# Patient Record
Sex: Female | Born: 1960 | Race: White | Hispanic: No | Marital: Single | Smoking: Never smoker
Health system: Southern US, Community
[De-identification: ages and names within clinical notes are randomized; demographics above are authoritative.]

## PROBLEM LIST (undated history)

## (undated) DIAGNOSIS — F32A Depression, unspecified: Secondary | ICD-10-CM

## (undated) DIAGNOSIS — E7800 Pure hypercholesterolemia, unspecified: Secondary | ICD-10-CM

## (undated) DIAGNOSIS — H9190 Unspecified hearing loss, unspecified ear: Secondary | ICD-10-CM

## (undated) DIAGNOSIS — E785 Hyperlipidemia, unspecified: Secondary | ICD-10-CM

## (undated) DIAGNOSIS — I10 Essential (primary) hypertension: Secondary | ICD-10-CM

## (undated) DIAGNOSIS — F840 Autistic disorder: Secondary | ICD-10-CM

## (undated) DIAGNOSIS — F73 Profound intellectual disabilities: Secondary | ICD-10-CM

## (undated) HISTORY — DX: Hyperlipidemia, unspecified: E78.5

## (undated) HISTORY — PX: KNEE SURGERY: SHX244

## (undated) HISTORY — DX: Unspecified hearing loss, unspecified ear: H91.90

## (undated) HISTORY — DX: Essential (primary) hypertension: I10

## (undated) HISTORY — DX: Depression, unspecified: F32.A

## (undated) HISTORY — DX: Pure hypercholesterolemia, unspecified: E78.00

## (undated) HISTORY — PX: BREAST REDUCTION SURGERY: SHX8

## (undated) HISTORY — DX: Profound intellectual disabilities: F73

## (undated) HISTORY — DX: Autistic disorder: F84.0

---

## 1999-03-29 ENCOUNTER — Ambulatory Visit (HOSPITAL_COMMUNITY): Admission: RE | Admit: 1999-03-29 | Discharge: 1999-03-29 | Payer: Self-pay | Admitting: Otolaryngology

## 2001-03-01 ENCOUNTER — Other Ambulatory Visit: Admission: RE | Admit: 2001-03-01 | Discharge: 2001-03-01 | Payer: Self-pay | Admitting: Obstetrics and Gynecology

## 2001-05-13 ENCOUNTER — Ambulatory Visit (HOSPITAL_COMMUNITY): Admission: RE | Admit: 2001-05-13 | Discharge: 2001-05-13 | Payer: Self-pay | Admitting: Neurology

## 2001-05-13 ENCOUNTER — Encounter: Payer: Self-pay | Admitting: Neurology

## 2004-02-24 ENCOUNTER — Encounter: Admission: RE | Admit: 2004-02-24 | Discharge: 2004-02-24 | Payer: Self-pay

## 2006-08-09 ENCOUNTER — Ambulatory Visit (HOSPITAL_BASED_OUTPATIENT_CLINIC_OR_DEPARTMENT_OTHER): Admission: RE | Admit: 2006-08-09 | Discharge: 2006-08-09 | Payer: Self-pay | Admitting: Orthopedic Surgery

## 2006-09-13 ENCOUNTER — Encounter: Admission: RE | Admit: 2006-09-13 | Discharge: 2006-09-13 | Payer: Self-pay | Admitting: Orthopedic Surgery

## 2007-03-19 ENCOUNTER — Other Ambulatory Visit: Admission: RE | Admit: 2007-03-19 | Discharge: 2007-03-19 | Payer: Self-pay | Admitting: Obstetrics and Gynecology

## 2007-11-01 IMAGING — US US EXTREM LOW VENOUS*L*
1 series · 14 of 24 positions shown · non-contrast
Comparison: none

CLINICAL DATA: Left lower leg pain and swelling.

LEFT LOWER EXTREMITY VENOUS DOPPLER ULTRASOUND:

[Series 1: unknown · 14 of 35 slices shown]
[im 1/35]
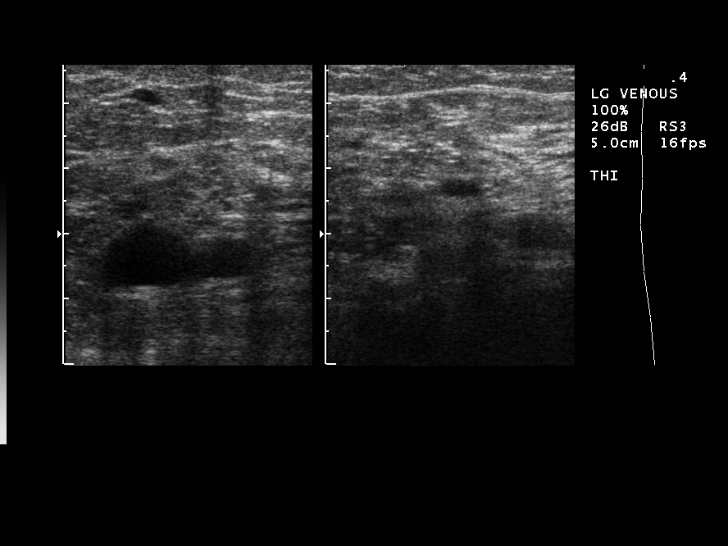
[im 3/35]
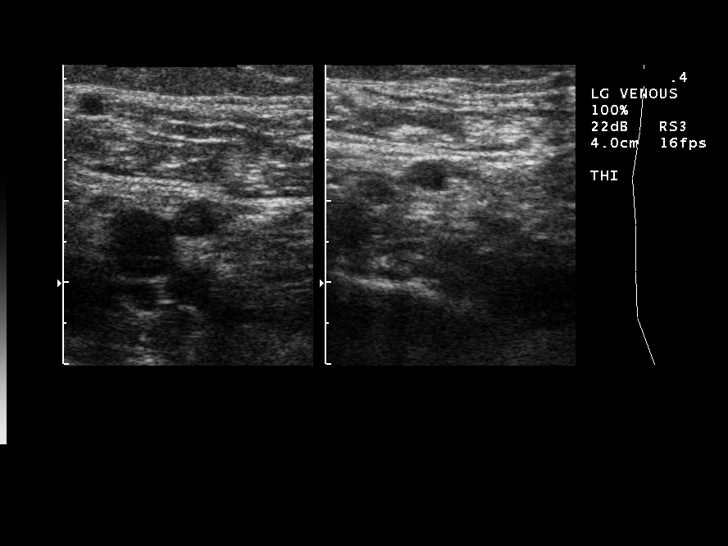
[im 6/35]
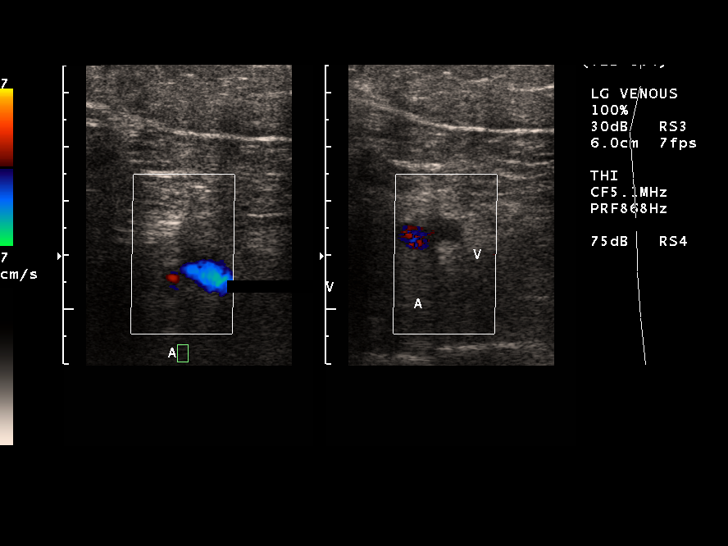
[im 9/35]
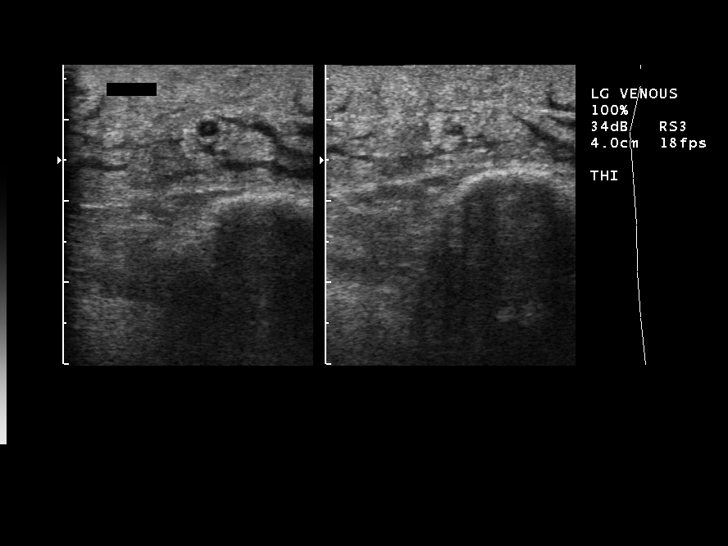
[im 11/35]
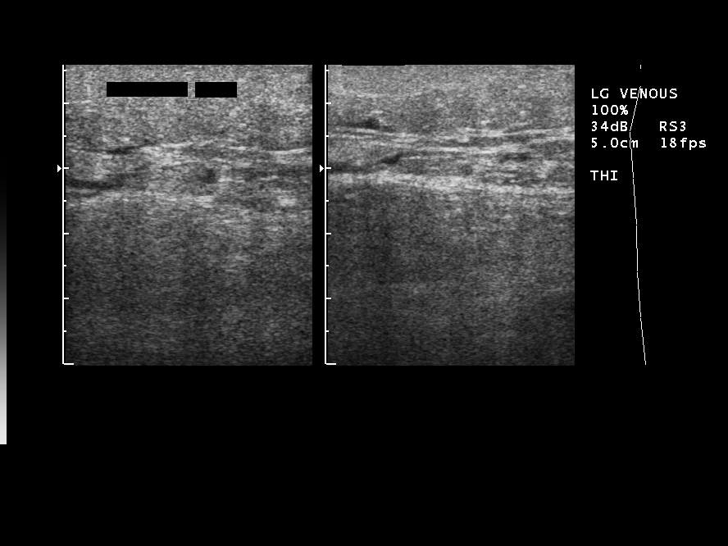
[im 14/35]
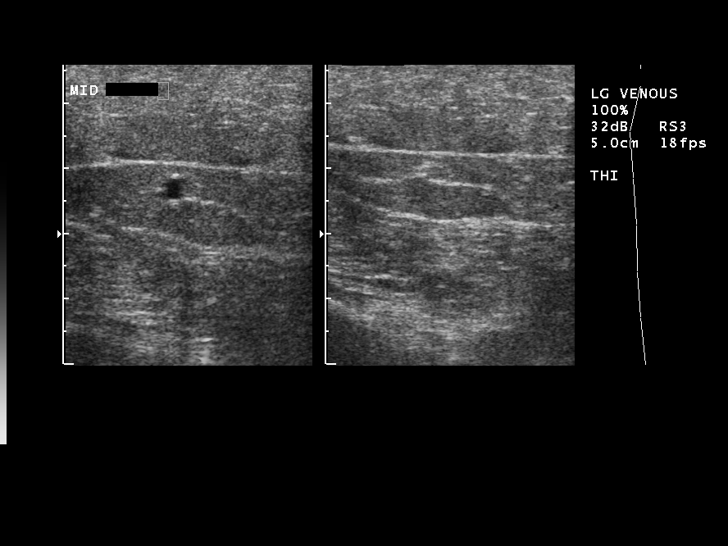
[im 17/35]
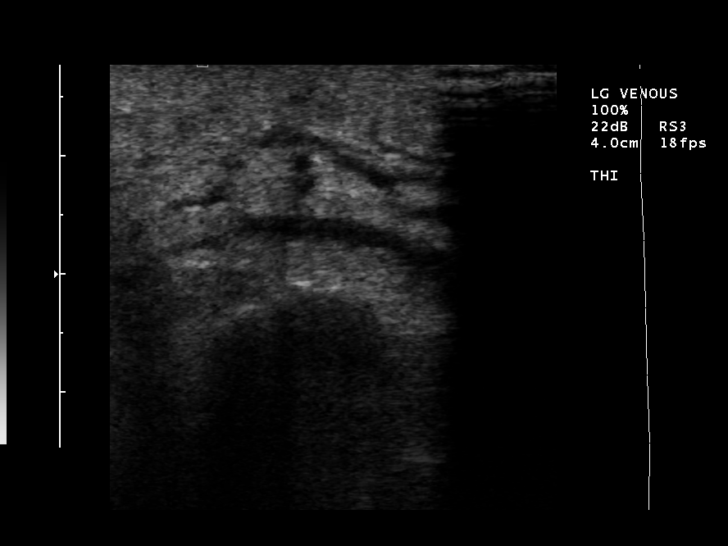
[im 18/35]
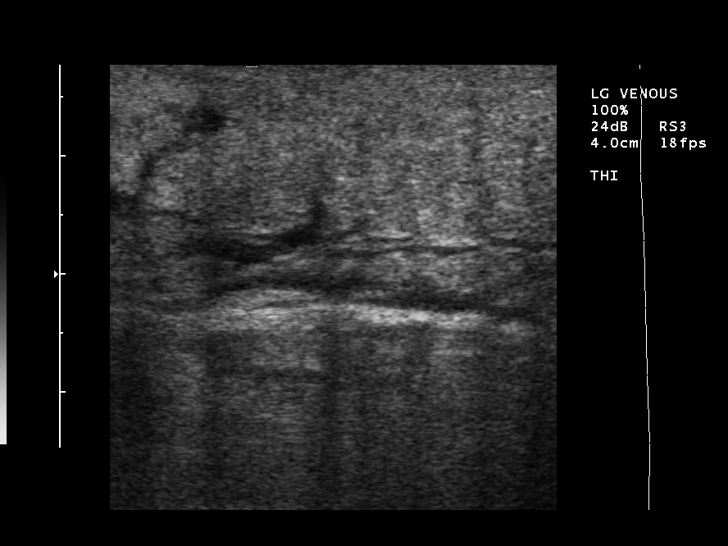
[im 21/35]
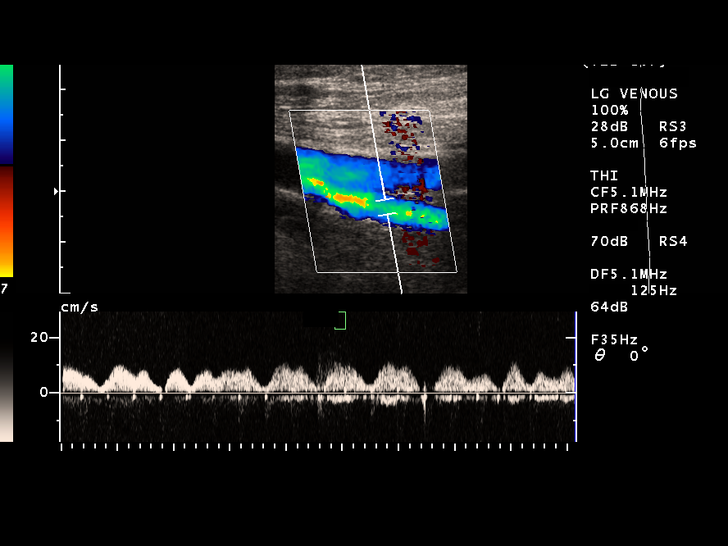
[im 24/35]
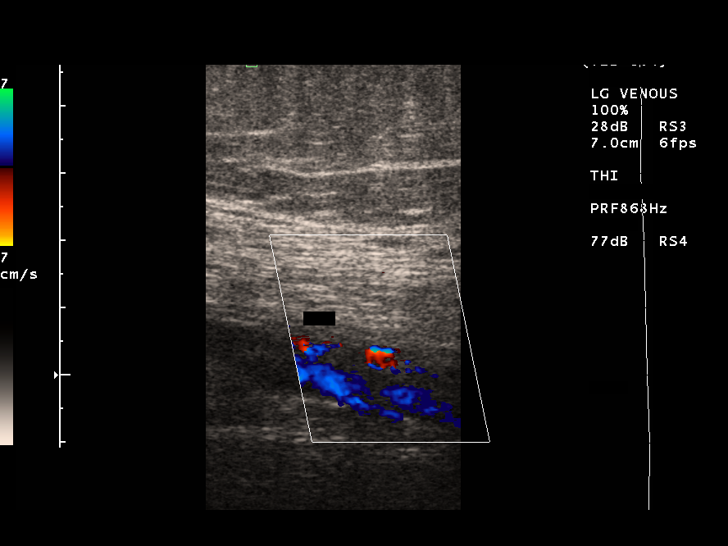
[im 27/35]
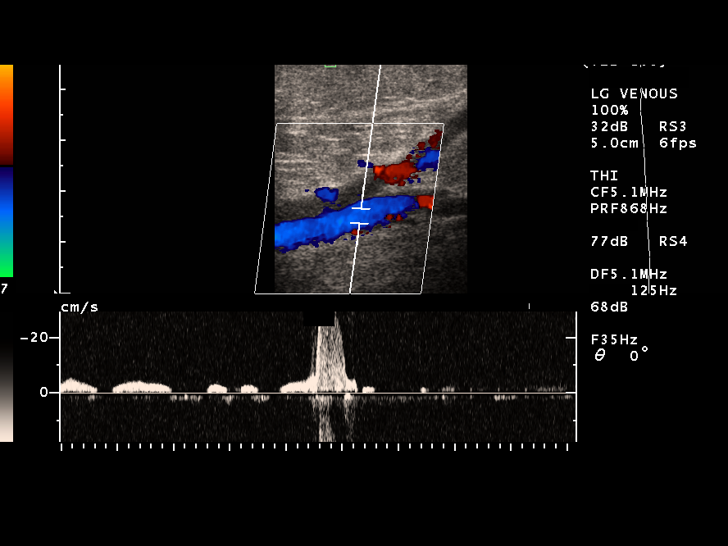
[im 29/35]
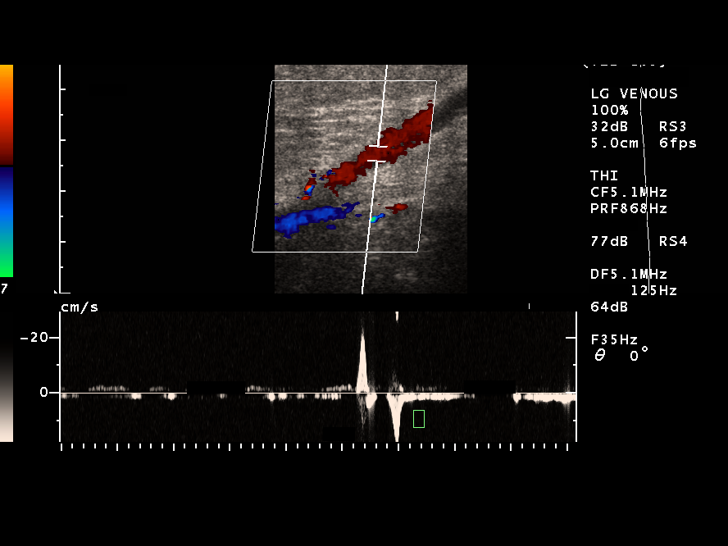
[im 32/35]
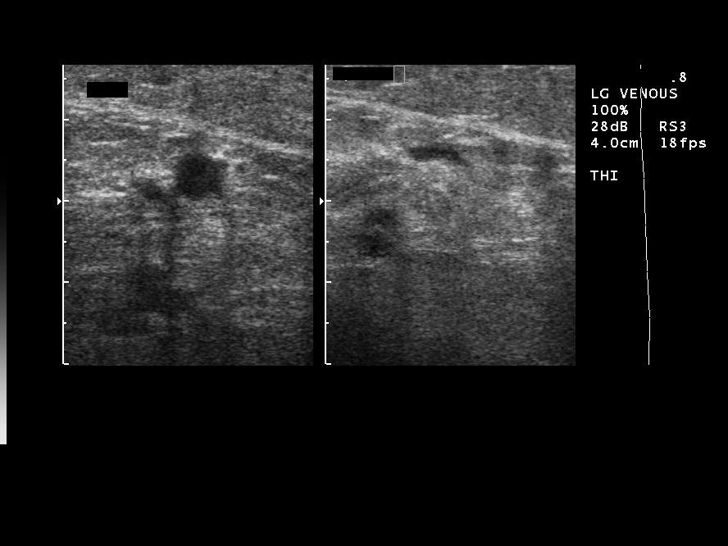
[im 35/35]
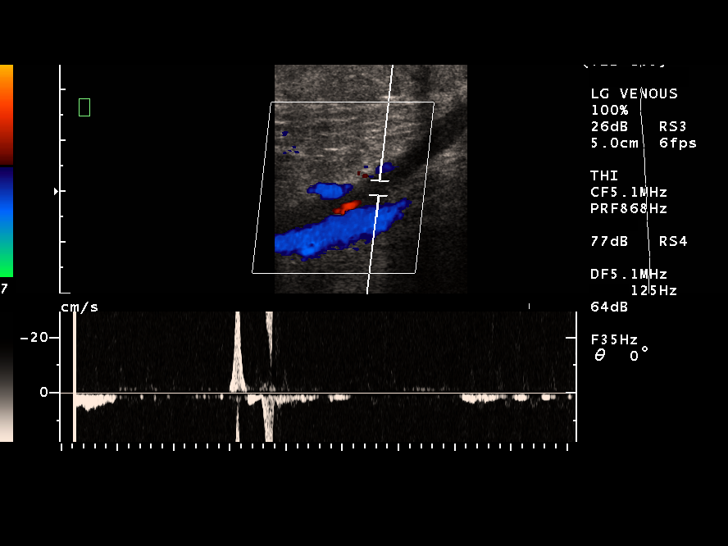

[14 of 24 positions shown; findings below may reference images not displayed]

FINDINGS: Real time, color-flow Doppler and duplex Doppler evaluation of the
left common femoral, superficial femoral, popliteal, deep femoral and greater
saphenous veins was performed. These have normal appearances and demonstrate
normal compressibility and augmentation. No intraluminal thrombi seen. Disuse
soft tissue swelling is noted in the lower leg.

IMPRESSION

Diffuse lower leg soft tissue swelling. Otherwise, normal examination. No deep
venous thrombi seen.

## 2008-04-06 ENCOUNTER — Other Ambulatory Visit: Admission: RE | Admit: 2008-04-06 | Discharge: 2008-04-06 | Payer: Self-pay | Admitting: Obstetrics and Gynecology

## 2010-04-08 ENCOUNTER — Other Ambulatory Visit: Admission: RE | Admit: 2010-04-08 | Discharge: 2010-04-08 | Payer: Self-pay | Admitting: Obstetrics and Gynecology

## 2010-11-04 NOTE — Op Note (Signed)
Jennifer Schultz, Jennifer Schultz              ACCOUNT NO.:  [id number]   MEDICAL RECORD NO.:  [id number]          PATIENT TYPE:  AMB   LOCATION:  DSC                          FACILITY:  [facility]   PHYSICIAN:  Rodney A. Mortenson, M.D.DATE OF BIRTH:  [birth year]   DATE OF PROCEDURE:  DATE OF DISCHARGE:                               OPERATIVE REPORT   Audio too short to transcribe (less than 5 seconds)           ______________________________  Lenard Galloway. Chaney Malling, M.D.     RAM/MEDQ  D:  08/09/2006  T:  08/09/2006  Job:  [id number]

## 2010-11-04 NOTE — Op Note (Signed)
Jennifer Schultz, Jennifer Schultz              ACCOUNT NO.:  [id number]   MEDICAL RECORD NO.:  [id number]          PATIENT TYPE:  AMB   LOCATION:  DSC                          FACILITY:  [facility]   PHYSICIAN:  Rodney A. Mortenson, M.D.DATE OF BIRTH:  1960-12-18   DATE OF PROCEDURE:  08/09/2006  DATE OF DISCHARGE:                               OPERATIVE REPORT   JUSTIFICATION:  A 50 year old female severely mentally retarded has not  been able walk with the left knee for the past several weeks.  She  cannot give any kind of history.  She will not weight bear.  She will  not move the knee.  She is an extreme pain.  Unable to get MRI on the  knee and she has to be put to sleep.  A long discussion with the mother  who is legal guardian and arthroscopic evaluation of the knee was  indicated and the family wished to proceed.  Questions answered and  encouraged preoperatively.  Complications discussed.   PREOPERATIVE DIAGNOSIS:  Probable internal derangement, left knee.   POSTOPERATIVE DIAGNOSIS:  Large osteochondral fracture with loose body,  left knee and lateral tracking of the left patella.   OPERATION:  Arthroscopy; removal of large loose body, lateral release.   SURGEON:  Lenard Galloway. Chaney Malling, M.D.   ANESTHESIA:  General.   PATHOLOGY:  With the arthroscope in the knee, very careful examination  was undertaken.  There was a great deal of blood and blood clots.  In  the suprapatellar pouch, there was a very large osteochondral fragment  which removed as a single large entity.  A very careful examination of  the superior pouch was done and no other loose bodies were seen.  The  posterior aspect of the patella appeared fairly normal with the patella  tracking far laterally.  The arthroscope was then brought into the  medial compartment.  There was normal articular cartilage of the medial  femoral condyle and medial tibial plateau and the entire circumference  of the medial meniscus was normal.  We  arthroscoped in the intercondylar  notch.  The ACL was intact, but there was a large defect on the medial  side of the medial femoral condyle and may be the origin of the  osteochondral fracture.  The lateral compartment appeared normal.   PROCEDURE:  The patient was placed on the operating table in the supine  position.  Pneumatic tourniquet applied to the left upper thigh.  Left  leg was placed in the leg holder and the entire left lower extremity  prepped with DuraPrep and draped out in the usual manner.  An infusion  cannula was placed in the superior medial pouch and the knee distended  with saline.  Anteromedial and anterolateral portals were made and the  arthroscope introduced.  Findings are as described above.  Using the  pituitary, the very large osteochondral fragment was removed.  Superior  pouch was then debrided with the intra-articular shaver.  There was  significant lateral tracking of the patella and partial lateral release  was done with the intra-articular shaver.  Once this was  accomplished,  the patella tracked more towards the midline.  The posterior aspect of  the patella did not show any obvious fractures.  Again the rest of the  knee was very carefully exam.  The ACL was intact, but there was a  defect on the medial side of the femoral notch adjacent to the inner  border of the medial femoral condyle and probably the origin of the  osteochondral fracture.  The medial meniscus was normal as noted above.  In the lateral compartment no pathology was seen.  A great deal of time  was spent looking throughout the knee and no other pathology was noted.  The knee was then filled with Marcaine.  A large bulky pressure dressing  applied.  The patient returned to recovery room in nice condition.  Technically this procedure went extremely well.   DISPOSITION:  1. Follow-up care to the office next week on Wednesday.  2. Vicodin for pain.            ______________________________  Lenard Galloway. Chaney Malling, M.D.     RAM/MEDQ  D:  08/09/2006  T:  08/09/2006  Job:  [id number]

## 2012-05-06 ENCOUNTER — Other Ambulatory Visit (HOSPITAL_COMMUNITY)
Admission: RE | Admit: 2012-05-06 | Discharge: 2012-05-06 | Disposition: A | Discharge status: Home / Self Care | Payer: Medicare Other | Source: Ambulatory Visit | Attending: Obstetrics and Gynecology | Admitting: Obstetrics and Gynecology

## 2012-05-06 ENCOUNTER — Other Ambulatory Visit: Payer: Self-pay | Admitting: Adult Health

## 2012-05-06 DIAGNOSIS — Z124 Encounter for screening for malignant neoplasm of cervix: Secondary | ICD-10-CM | POA: Insufficient documentation

## 2012-05-06 DIAGNOSIS — Z1151 Encounter for screening for human papillomavirus (HPV): Secondary | ICD-10-CM | POA: Insufficient documentation

## 2012-12-27 ENCOUNTER — Ambulatory Visit: Payer: Medicare Other | Attending: Internal Medicine | Admitting: Occupational Therapy

## 2012-12-27 DIAGNOSIS — R4189 Other symptoms and signs involving cognitive functions and awareness: Secondary | ICD-10-CM | POA: Insufficient documentation

## 2012-12-27 DIAGNOSIS — IMO0001 Reserved for inherently not codable concepts without codable children: Secondary | ICD-10-CM | POA: Insufficient documentation

## 2016-09-20 ENCOUNTER — Encounter: Payer: Self-pay | Admitting: *Deleted

## 2016-09-20 ENCOUNTER — Other Ambulatory Visit: Payer: Self-pay | Admitting: Adult Health

## 2017-01-31 ENCOUNTER — Other Ambulatory Visit (HOSPITAL_COMMUNITY)
Admission: RE | Admit: 2017-01-31 | Discharge: 2017-01-31 | Disposition: A | Discharge status: Home / Self Care | Payer: Medicare Other | Source: Ambulatory Visit | Attending: Adult Health | Admitting: Adult Health

## 2017-01-31 ENCOUNTER — Encounter: Payer: Self-pay | Admitting: Adult Health

## 2017-01-31 ENCOUNTER — Ambulatory Visit (INDEPENDENT_AMBULATORY_CARE_PROVIDER_SITE_OTHER): Payer: Medicare Other | Admitting: Adult Health

## 2017-01-31 VITALS — BP 142/90 | HR 70 | Ht 60.0 in | Wt 123.0 lb

## 2017-01-31 DIAGNOSIS — Z1211 Encounter for screening for malignant neoplasm of colon: Secondary | ICD-10-CM | POA: Insufficient documentation

## 2017-01-31 DIAGNOSIS — Z124 Encounter for screening for malignant neoplasm of cervix: Secondary | ICD-10-CM | POA: Insufficient documentation

## 2017-01-31 DIAGNOSIS — Z1212 Encounter for screening for malignant neoplasm of rectum: Secondary | ICD-10-CM | POA: Insufficient documentation

## 2017-01-31 DIAGNOSIS — Z0000 Encounter for general adult medical examination without abnormal findings: Secondary | ICD-10-CM

## 2017-01-31 DIAGNOSIS — Z01419 Encounter for gynecological examination (general) (routine) without abnormal findings: Secondary | ICD-10-CM | POA: Diagnosis not present

## 2017-01-31 DIAGNOSIS — N926 Irregular menstruation, unspecified: Secondary | ICD-10-CM | POA: Insufficient documentation

## 2017-01-31 LAB — HEMOCCULT GUIAC POC 1CARD (OFFICE): FECAL OCCULT BLD: NEGATIVE

## 2017-01-31 NOTE — Progress Notes (Signed)
Patient ID: Jennifer Schultz, female   DOB: 12-10-1960, 56 y.o.   MRN: [id number] History of Present Illness: Jennifer Schultz is a 56 year old white female with autism that resides at [facility].Caregiver with her.  PCP is Madelon LipsJoanna Cater, FNP and she sees Dr Clovis Fredricksonhandler,psycharitis, in [place].  Current Medications, Allergies, Past Medical History, Past Surgical History, Family History and Social History were reviewed in [facility] electronic medical record.     Review of Systems: Patient denies any headaches, hearing loss, fatigue, blurred vision, shortness of breath, chest pain, abdominal pain, problems with bowel movements, urination, or intercourse(never had sex). No joint pain or mood swings. She is non verbal but care giver says she has no complaints, is on OCs and periods not regular will miss for 3-4 months.   Physical Exam:BP (!) 142/90 (BP Location: Left Arm, Patient Position: Sitting, Cuff Size: Normal)   Pulse 70   Ht 5' (1.524 m)   Wt 123 lb (55.8 kg)   BMI 24.02 kg/m  General:  Well developed, well nourished, no acute distress Skin:  Warm and dry and clean Neck:  Midline trachea, normal thyroid, good ROM, no lymphadenopathy Lungs; Clear to auscultation bilaterally Breast:  No dominant palpable mass, retraction, or nipple discharge,sp breast reduction,scars well healed Cardiovascular: Regular rate and rhythm Abdomen:  Soft, non tender, no hepatosplenomegaly Pelvic:  External genitalia is normal in appearance, no lesions.  The vagina is normal in appearance. Urethra has no lesions or masses. The cervix is is not visualized and pap with HPV done with blind sweep, used pediatric speculum.  Uterus is felt to be normal size, shape, and contour.  No adnexal masses or tenderness noted.Bladder is non tender, no masses felt. Rectal: Good sphincter tone, no polyps, or hemorrhoids felt.  Hemoccult negative. Extremities/musculoskeletal:  No swelling or varicosities noted, no clubbing  or cyanosis Psych: Non verbal, cooperative PHQ 2 score 0.   Impression: 1. Encounter for routine gynecological examination with Papanicolaou smear of cervix   2. Routine cervical smear   3. Menstrual periods irregular   4. Screening for colorectal cancer       Plan: Stop OCs for 4 weeks and check FSH of not PM resume  Pap and physical in 3 years Mammograms yearly Labs per PCP

## 2017-02-02 LAB — CYTOLOGY - PAP
Diagnosis: NEGATIVE
HPV (WINDOPATH): NOT DETECTED

## 2017-03-07 ENCOUNTER — Encounter: Payer: Self-pay | Admitting: Physician Assistant

## 2017-03-07 ENCOUNTER — Ambulatory Visit: Payer: Medicaid Other | Admitting: Physician Assistant

## 2017-03-07 VITALS — BP 140/100 | HR 83 | Temp 97.9°F | Wt 123.6 lb

## 2017-03-07 DIAGNOSIS — Z1239 Encounter for other screening for malignant neoplasm of breast: Secondary | ICD-10-CM

## 2017-03-07 DIAGNOSIS — I10 Essential (primary) hypertension: Secondary | ICD-10-CM | POA: Insufficient documentation

## 2017-03-07 DIAGNOSIS — E785 Hyperlipidemia, unspecified: Secondary | ICD-10-CM | POA: Insufficient documentation

## 2017-03-07 DIAGNOSIS — F840 Autistic disorder: Secondary | ICD-10-CM | POA: Insufficient documentation

## 2017-03-07 DIAGNOSIS — R4701 Aphasia: Secondary | ICD-10-CM | POA: Insufficient documentation

## 2017-03-07 DIAGNOSIS — F79 Unspecified intellectual disabilities: Secondary | ICD-10-CM | POA: Insufficient documentation

## 2017-03-07 NOTE — Progress Notes (Signed)
BP (!) 140/100 (BP Location: Left Arm, Patient Position: Sitting, Cuff Size: Normal)   Pulse 83   Temp 97.9 F (36.6 C)   Wt 123 lb 9.6 oz (56.1 kg)   SpO2 93%   BMI 24.14 kg/m    Subjective:    Patient ID: Jennifer Schultz, female    DOB: 03-27-1961, 56 y.o.   MRN: [id number]  HPI: Jennifer Schultz is a 56 y.o. female presenting on 03/07/2017 for Follow-up   HPI   I am seeing pt at [facility] clinic as interim provider until new permanent provider takes over mid-October   pt is resident of [facility] and only communicates with very limited sign language.  She has intellectual disability, autism spectrum disorder, expressive aphasia.  Her caregiver is with her today and reports no recent problems.   Pt is unable to provide and information.   Relevant past medical, surgical, family and social history reviewed and updated as indicated. Interim medical history since our last visit reviewed. Allergies and medications reviewed and updated.   Current Outpatient Prescriptions:  .  amLODipine (NORVASC) 2.5 MG tablet, Take 2.5 mg by mouth daily. , Disp: , Rfl:  .  carbamide peroxide (DEBROX) 6.5 % OTIC solution, Place 3-4 drops into both ears once a week., Disp: , Rfl:  .  chlorhexidine (PERIDEX) 0.12 % solution, Use as directed 10 mLs in the mouth or throat 2 times daily. Swish 1 to 2 minutes and expectorate, Disp: , Rfl:  .  docusate sodium (COLACE) 100 MG capsule, Take 100 mg by mouth 2 (two) times daily. , Disp: , Rfl:  .  FLUoxetine (PROZAC) 20 MG capsule, Take 20 mg by mouth daily. , Disp: , Rfl:  .  ketoconazole (NIZORAL) 2 % cream, Apply 1 application topically 2 (two) times daily., Disp: , Rfl:  .  levonorgestrel-ethinyl estradiol (NORDETTE) 0.15-30 MG-MCG tablet, Take 1 tablet by mouth daily. , Disp: , Rfl:  .  pravastatin (PRAVACHOL) 80 MG tablet, Take 80 mg by mouth daily., Disp: , Rfl:  .  selenium sulfide (SELSUN) 2.5 % shampoo, Apply 1 application topically once a  week., Disp: , Rfl:  .  alum & mag hydroxide-simeth (ANTACID LIQUID) 200-200-20 MG/5ML suspension, Take 10 mLs by mouth as needed for indigestion or heartburn., Disp: , Rfl:  .  diphenhydrAMINE (BENADRYL) 25 mg capsule, Take 25 mg by mouth as needed for allergies., Disp: , Rfl:  .  hydrocortisone cream 1 %, Apply 1 application topically 3 (three) times daily., Disp: , Rfl:  .  loperamide (IMODIUM) 2 MG capsule, Take 4 mg by mouth as needed for diarrhea or loose stools., Disp: , Rfl:  .  magnesium hydroxide (MILK OF MAGNESIA) 400 MG/5ML suspension, Take 30 mLs by mouth as needed for mild constipation., Disp: , Rfl:  .  Vitamin D, Ergocalciferol, (DRISDOL) 50000 units CAPS capsule, Take 50,000 Units by mouth every 7 (seven) days. ON SUNDAYS, Disp: , Rfl:    Review of Systems  Constitutional: Negative for appetite change, chills, diaphoresis, fatigue, fever and unexpected weight change.  HENT: Negative for congestion, drooling, ear pain, facial swelling, hearing loss, mouth sores, sneezing, sore throat, trouble swallowing and voice change.   Eyes: Negative for pain, discharge, redness, itching and visual disturbance.  Respiratory: Negative for cough, choking, shortness of breath and wheezing.   Cardiovascular: Negative for chest pain, palpitations and leg swelling.  Gastrointestinal: Negative for abdominal pain, blood in stool, constipation, diarrhea and vomiting.  Endocrine:  Negative for cold intolerance, heat intolerance and polydipsia.  Genitourinary: Negative for decreased urine volume, dysuria and hematuria.  Musculoskeletal: Negative for arthralgias, back pain and gait problem.  Skin: Negative for rash.  Allergic/Immunologic: Negative for environmental allergies.  Neurological: Negative for seizures, syncope, light-headedness and headaches.  Hematological: Negative for adenopathy.  Psychiatric/Behavioral: Negative for agitation, dysphoric mood and suicidal ideas. The patient is  nervous/anxious.     Per HPI unless specifically indicated above     Objective:    BP (!) 140/100 (BP Location: Left Arm, Patient Position: Sitting, Cuff Size: Normal)   Pulse 83   Temp 97.9 F (36.6 C)   Wt 123 lb 9.6 oz (56.1 kg)   SpO2 93%   BMI 24.14 kg/m   Wt Readings from Last 3 Encounters:  03/07/17 123 lb 9.6 oz (56.1 kg)  01/31/17 123 lb (55.8 kg)    Physical Exam  Constitutional: She appears well-developed and well-nourished.  Pt is flailing out with her arms and legs today, spontaneously, without warning. This somewhat limits today's exam  HENT:  Head: Normocephalic and atraumatic.  Neck: Neck supple.  Cardiovascular: Normal rate and regular rhythm.   Pulmonary/Chest: Effort normal and breath sounds normal.  Abdominal: Soft. Bowel sounds are normal. She exhibits no mass. There is no hepatosplenomegaly. There is no tenderness.  Musculoskeletal: She exhibits no edema.  Lymphadenopathy:    She has no cervical adenopathy.  Neurological: She is alert.  Skin: Skin is warm and dry.  Psychiatric: Her mood appears anxious. She is noncommunicative.  Vitals reviewed.       Assessment & Plan:    Encounter Diagnoses  Name Primary?  . Essential hypertension Yes  . Hyperlipidemia, unspecified hyperlipidemia type   . Intellectual disability   . Autism spectrum disorder   . Expressive aphasia   . Screening for breast cancer     -no changes to medications today -will monitor blood pressure -will Check lipids, cmp -will Order screening mammogram -pt to follow up in 3 months. RTO sooner prn

## 2019-01-23 ENCOUNTER — Other Ambulatory Visit: Payer: Self-pay | Admitting: Family

## 2019-01-23 ENCOUNTER — Other Ambulatory Visit (HOSPITAL_COMMUNITY): Payer: Self-pay | Admitting: Family

## 2019-01-23 DIAGNOSIS — M79662 Pain in left lower leg: Secondary | ICD-10-CM

## 2019-01-28 ENCOUNTER — Ambulatory Visit (HOSPITAL_COMMUNITY)
Admission: RE | Admit: 2019-01-28 | Discharge: 2019-01-28 | Disposition: A | Discharge status: Home / Self Care | Payer: Medicare Other | Source: Ambulatory Visit | Attending: Family | Admitting: Family

## 2019-01-28 ENCOUNTER — Other Ambulatory Visit: Payer: Self-pay

## 2019-01-28 DIAGNOSIS — M79662 Pain in left lower leg: Secondary | ICD-10-CM

## 2019-07-29 ENCOUNTER — Ambulatory Visit: Payer: Medicare Other | Admitting: Urology

## 2020-09-01 ENCOUNTER — Encounter: Payer: Self-pay | Admitting: Sports Medicine

## 2020-10-21 ENCOUNTER — Encounter: Payer: Self-pay | Admitting: *Deleted

## 2020-10-22 ENCOUNTER — Encounter: Payer: Medicare Other | Admitting: Cardiology

## 2020-10-22 ENCOUNTER — Encounter: Payer: Self-pay | Admitting: Cardiology

## 2020-10-22 NOTE — Progress Notes (Signed)
This encounter was created in error - please disregard.

## 2020-12-06 ENCOUNTER — Encounter: Payer: Self-pay | Admitting: Podiatry

## 2020-12-06 ENCOUNTER — Other Ambulatory Visit: Payer: Self-pay

## 2020-12-06 ENCOUNTER — Ambulatory Visit (INDEPENDENT_AMBULATORY_CARE_PROVIDER_SITE_OTHER): Payer: Medicare Other | Admitting: Podiatry

## 2020-12-06 DIAGNOSIS — R52 Pain, unspecified: Secondary | ICD-10-CM | POA: Diagnosis not present

## 2020-12-06 DIAGNOSIS — L84 Corns and callosities: Secondary | ICD-10-CM | POA: Diagnosis not present

## 2020-12-06 MED ORDER — UREA 40 % EX CREA: 1.0000 "application " | TOPICAL_CREAM | Freq: Two times a day (BID) | CUTANEOUS | 1 refills | Status: DC

## 2020-12-07 NOTE — Progress Notes (Signed)
  Subjective:  Patient ID: LANE KJOS, female    DOB: 12/21/1960,  MRN: [id number]  Chief Complaint  Patient presents with   Callouses    Pt has corn and callouses     60 y.o. female presents with the above complaint. History confirmed with patient.  Here with a caregiver.  She is aphasic  Objective:  Physical Exam: warm, good capillary refill, no trophic changes or ulcerative lesions, normal DP and PT pulses, and normal sensory exam.  She has submetatarsal 1 bilateral hyperkeratosis and some at 5 on the left.  Elongated thickened dystrophic nails. Assessment:   1. Callus of foot   2. Pain      Plan:  Patient was evaluated and treated and all questions answered.  Discussed foot health and care for these.  Discussed use of urea cream for the symptomatic calluses.  I debrided lesions as a courtesy today.  Discussed with caregiver that is unfortunately not a covered service that she does not have qualifying medical condition.  Currently they are able to take care of her nails for her at the facility as well.  Return as needed  Return if symptoms worsen or fail to improve.

## 2020-12-27 ENCOUNTER — Ambulatory Visit (INDEPENDENT_AMBULATORY_CARE_PROVIDER_SITE_OTHER): Payer: Medicare Other | Admitting: Cardiology

## 2020-12-27 ENCOUNTER — Other Ambulatory Visit: Payer: Self-pay

## 2020-12-27 ENCOUNTER — Encounter: Payer: Self-pay | Admitting: Cardiology

## 2020-12-27 VITALS — BP 126/78 | HR 52 | Ht 67.5 in | Wt 177.6 lb

## 2020-12-27 DIAGNOSIS — R9431 Abnormal electrocardiogram [ECG] [EKG]: Secondary | ICD-10-CM

## 2020-12-27 DIAGNOSIS — I10 Essential (primary) hypertension: Secondary | ICD-10-CM

## 2020-12-27 NOTE — Patient Instructions (Signed)
Medication Instructions:  Your physician recommends that you continue on your current medications as directed. Please refer to the Current Medication list given to you today.  *If you need a refill on your cardiac medications before your next appointment, please call your pharmacy*   Lab Work: None today  If you have labs (blood work) drawn today and your tests are completely normal, you will receive your results only by: MyChart Message (if you have MyChart) OR A paper copy in the mail If you have any lab test that is abnormal or we need to change your treatment, we will call you to review the results.   Testing/Procedures: Your physician has requested that you have an echocardiogram. Echocardiography is a painless test that uses sound waves to create images of your heart. It provides your doctor with information about the size and shape of your heart and how well your heart's chambers and valves are working. This procedure takes approximately one hour. There are no restrictions for this procedure.    Follow-Up: At [facility], you and your health needs are our priority.  As part of our continuing mission to provide you with exceptional heart care, we have created designated Provider Care Teams.  These Care Teams include your primary Cardiologist (physician) and Advanced Practice Providers (APPs -  Physician Assistants and Nurse Practitioners) who all work together to provide you with the care you need, when you need it.  We recommend signing up for the patient portal called "MyChart".  Sign up information is provided on this After Visit Summary.  MyChart is used to connect with patients for Virtual Visits (Telemedicine).  Patients are able to view lab/test results, encounter notes, upcoming appointments, etc.  Non-urgent messages can be sent to your provider as well.   To learn more about what you can do with MyChart, go to [email or web address].    Your next appointment:  to be  determined after echocardiogram

## 2020-12-27 NOTE — Progress Notes (Signed)
Cardiology Office Note  Date: 12/27/2020   ID: Jennifer Schultz, DOB 12/03/1960, MRN [id number]  PCP:  Waldon Reining, MD  Cardiologist:  Nona Dell, MD Electrophysiologist:  None   Chief Complaint  Patient presents with   Referred for evaluation of abnormal ECG     History of Present Illness: Jennifer Schultz is a 60 y.o. female referred for cardiology consultation by Dr. Dahlia Client from the [facility] for evaluation of abnormal ECG.  She is a 25-year resident of [facility], here with an Geophysicist/field seismologist today.  She does not communicate verbally, but does understand some sign language.  Per discussion with assistant, patient mainly complains of arthritic knee pain, sometimes intermittent abdominal discomfort, but no progressive shortness of breath or chest pain.  I personally reviewed her ECG from 08/26/2020 which showed sinus tachycardia at 122 bpm with IVCD and poor R wave progression.  Repeat tracing today shows sinus bradycardia at 54 bpm, similar IVCD.  She does have a history of hypertension and hyperlipidemia, on Zestoretic and Pravachol.  Systolic is in the 120s today.  She was evaluated by Dr. Dahlia Client in March for a basic physical in preparation for routine dental work.  Past Medical History:  Diagnosis Date   Autism spectrum    Deaf    Depression    Essential hypertension    Hyperlipidemia    Idiopathic profound intellectual disability     Past Surgical History:  Procedure Laterality Date   BREAST REDUCTION SURGERY     KNEE SURGERY Left     Current Outpatient Medications  Medication Sig Dispense Refill   ABILIFY 2 MG tablet Take 4 mg by mouth daily.     alprazolam (XANAX) 2 MG tablet Take 2 mg by mouth at bedtime as needed for sleep.     alum & mag hydroxide-simeth (MAALOX/MYLANTA) 200-200-20 MG/5ML suspension Take 10 mLs by mouth as needed for indigestion or heartburn.     ammonium lactate (LAC-HYDRIN) 12 % lotion      carbamide  peroxide (DEBROX) 6.5 % OTIC solution Place 3-4 drops into both ears once a week.     chlorhexidine (PERIDEX) 0.12 % solution Use as directed 10 mLs in the mouth or throat 2 times daily. Swish 1 to 2 minutes and expectorate     FLUoxetine (PROZAC) 10 MG tablet Take 10 mg by mouth daily. Take with the 20 mg tablet     FLUoxetine (PROZAC) 20 MG capsule Take 20 mg by mouth daily.      hydrochlorothiazide (HYDRODIURIL) 25 MG tablet      levonorgestrel-ethinyl estradiol (NORDETTE) 0.15-30 MG-MCG tablet      meloxicam (MOBIC) 7.5 MG tablet Take 7.5 mg by mouth daily.     pravastatin (PRAVACHOL) 80 MG tablet Take 80 mg by mouth daily.     selenium sulfide (SELSUN) 2.5 % shampoo Apply 1 application topically once a week.     hydrocortisone cream 1 % Apply 1 application topically 3 (three) times daily. (Patient not taking: Reported on 12/27/2020)     ketoconazole (NIZORAL) 2 % cream Apply 1 application topically 2 (two) times daily. (Patient not taking: Reported on 12/27/2020)     lisinopril-hydrochlorothiazide (ZESTORETIC) 20-25 MG tablet Take 1 tablet by mouth daily. (Patient not taking: Reported on 12/27/2020)     loperamide (IMODIUM) 2 MG capsule Take 4 mg by mouth as needed for diarrhea or loose stools. (Patient not taking: Reported on 12/27/2020)     magnesium hydroxide (MILK  OF MAGNESIA) 400 MG/5ML suspension Take 30 mLs by mouth as needed for mild constipation. (Patient not taking: Reported on 12/27/2020)     urea (CARMOL) 40 % CREA Apply 1 application topically 2 (two) times daily. Apply to the callused areas (Patient not taking: Reported on 12/27/2020) 120 g 1   Vitamin D, Ergocalciferol, (DRISDOL) 50000 units CAPS capsule Take 50,000 Units by mouth every 7 (seven) days. ON SUNDAYS (Patient not taking: Reported on 12/27/2020)     No current facility-administered medications for this visit.   Allergies:  Patient has no known allergies.   Social History: The patient  reports that she has never smoked.  She has never used smokeless tobacco. She reports that she does not drink alcohol and does not use drugs.   Family History: The patient's Family history is unknown by patient.   ROS: Unable to obtain.  Physical Exam: VS:  BP 126/78   Pulse (!) 52   Ht 5' 7.5" (1.715 m)   Wt 177 lb 9.6 oz (80.6 kg)   SpO2 99%   BMI 27.41 kg/m , BMI Body mass index is 27.41 kg/m.  Wt Readings from Last 3 Encounters:  12/27/20 177 lb 9.6 oz (80.6 kg)  03/07/17 123 lb 9.6 oz (56.1 kg)  01/31/17 123 lb (55.8 kg)    General: Patient appears comfortable at rest. HEENT: Conjunctiva and lids normal, wearing a mask. Neck: Supple, no elevated JVP or carotid bruits, no thyromegaly. Lungs: Clear to auscultation, nonlabored breathing at rest. Cardiac: Regular rate and rhythm, no S3, 1/6 systolic murmur, no pericardial rub. Abdomen: Soft, bowel sounds present, no guarding or rebound. Extremities: Mild ankle edema, distal pulses 2+. Skin: Warm and dry. Musculoskeletal: No kyphosis. Neuropsychiatric: Alert and oriented x3, affect grossly appropriate.  ECG:   No old tracings prior to March 2022 for review today.  Recent Labwork:  July 2022: Hemoglobin 11.6, platelets 304, potassium 3.7, BUN 16, creatinine 0.94, AST 19, ALT 25  Other Studies Reviewed Today:  Chest x-ray 12/18/2020: IMPRESSION:  1. Cardiac enlargement with perihilar infiltration or edema.  2. Normal nonobstructive bowel gas pattern.   Assessment and Plan:  1.  ECG showing nonspecific IVCD, no definite history of ischemic heart disease or previous anteroseptal infarct based on available information.  I reviewed her recent lab work as noted above.  Cardiac exam relatively benign with soft systolic murmur, lungs are clear.  Plan is to obtain an echocardiogram to assess cardiac structure and function.  I do not see a specific limitation for her to undergo routine dental work even under sedation from a cardiac perspective.  If she has any  evidence of cardiomyopathy, this will help to guide medical therapy, but would not anticipate an ischemic work-up however.  2.  Essential hypertension, blood pressure is reasonably controlled today on Zestoretic.  Medication Adjustments/Labs and Tests Ordered: Current medicines are reviewed at length with the patient today.  Concerns regarding medicines are outlined above.   Tests Ordered: Orders Placed This Encounter  Procedures   EKG 12-Lead   ECHOCARDIOGRAM COMPLETE     Medication Changes: No orders of the defined types were placed in this encounter.   Disposition:  Follow up  test results.  Signed, Jonelle Sidle, MD, [facility] 12/27/2020 9:41 AM    [facility] at [facility] [address] [place], [place], [place] [address] Phone: [phone]; Fax: [phone]

## 2021-01-04 ENCOUNTER — Ambulatory Visit (HOSPITAL_COMMUNITY): Admission: RE | Admit: 2021-01-04 | Payer: Medicare Other | Source: Ambulatory Visit

## 2021-01-13 ENCOUNTER — Other Ambulatory Visit: Payer: Self-pay

## 2021-01-13 ENCOUNTER — Ambulatory Visit (HOSPITAL_COMMUNITY)
Admission: RE | Admit: 2021-01-13 | Discharge: 2021-01-13 | Disposition: A | Discharge status: Home / Self Care | Payer: Medicare Other | Source: Ambulatory Visit | Attending: Cardiology | Admitting: Cardiology

## 2021-01-13 ENCOUNTER — Telehealth: Payer: Self-pay | Admitting: *Deleted

## 2021-01-13 DIAGNOSIS — R9431 Abnormal electrocardiogram [ECG] [EKG]: Secondary | ICD-10-CM | POA: Diagnosis not present

## 2021-01-13 LAB — ECHOCARDIOGRAM COMPLETE
AR max vel: 1.75 cm2
AV Area VTI: 1.6 cm2
AV Area mean vel: 1.81 cm2
AV Mean grad: 4.6 mmHg
AV Peak grad: 10 mmHg
Ao pk vel: 1.58 m/s
Area-P 1/2: 3.59 cm2
S' Lateral: 2.6 cm

## 2021-01-13 NOTE — Telephone Encounter (Signed)
-----   Message from Jonelle Sidle, MD sent at 01/13/2021  1:06 PM EDT ----- Results reviewed.  LVEF is normal at 55 to 60% without wall motion abnormalities to suggest prior infarct.  Mild mitral regurgitation, otherwise no significant valvular abnormalities.  Would not anticipate any further cardiac work-up.  Keep follow-up with Dr. Dahlia Client.

## 2021-01-13 NOTE — Progress Notes (Signed)
*  PRELIMINARY RESULTS* Echocardiogram 2D Echocardiogram has been performed.  Stacey Drain 01/13/2021, 12:23 PM

## 2021-01-14 NOTE — Telephone Encounter (Signed)
Lesle Chris, LPN  8/83/2549  9:22 AM EDT Back to Top     Christella Scheuermann ([facility]) notified.  Copy to pcp & faxed to group home ather request.  Fax:  [phone].     Eustace Moore, RN  01/13/2021  4:53 PM EDT      Left message for patient to call office.

## 2021-03-15 ENCOUNTER — Ambulatory Visit: Payer: Medicare Other | Admitting: Urology

## 2021-03-15 DIAGNOSIS — R350 Frequency of micturition: Secondary | ICD-10-CM

## 2021-03-15 DIAGNOSIS — R32 Unspecified urinary incontinence: Secondary | ICD-10-CM

## 2021-03-21 ENCOUNTER — Encounter (INDEPENDENT_AMBULATORY_CARE_PROVIDER_SITE_OTHER): Payer: Self-pay | Admitting: *Deleted

## 2021-05-05 IMAGING — US VENOUS DOPPLER ULTRASOUND OF LEFT LOWER EXTREMITY
1 series · 13 of 24 positions shown · non-contrast
Comparison: None.

CLINICAL DATA: Left lower extremity edema.



[Series 1: venous doppler ultrasound of left lower extremity · 0.08mm/px · 13 of 50 slices shown]
[im 1/50]
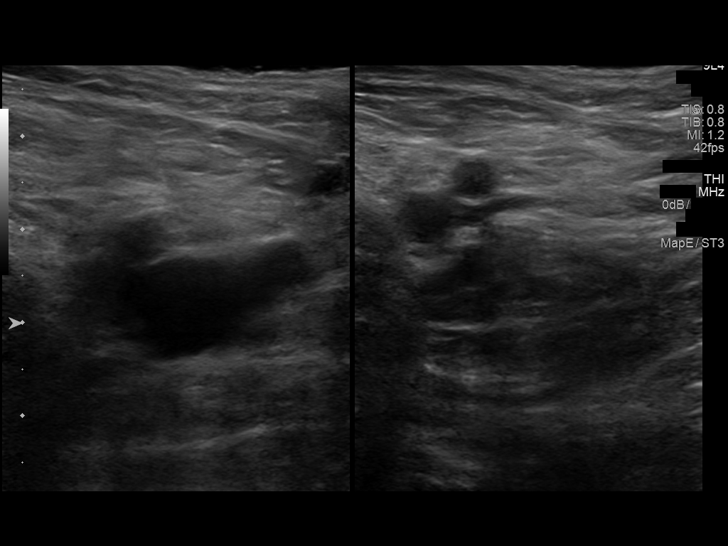
[im 5/50]
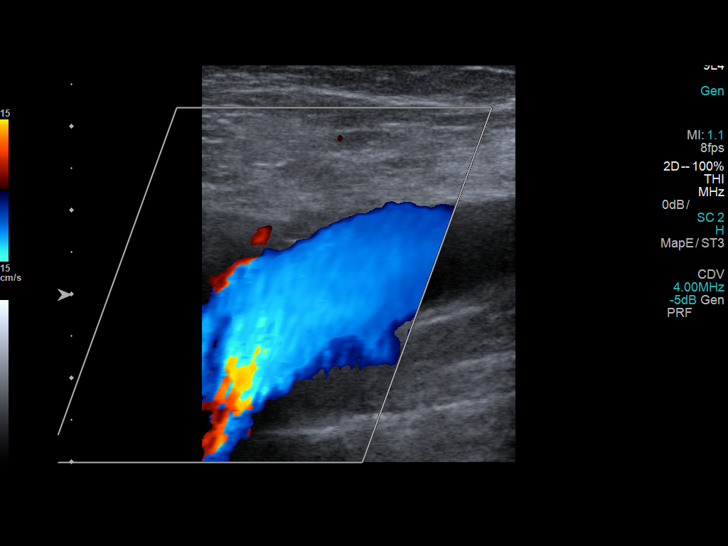
[im 9/50]
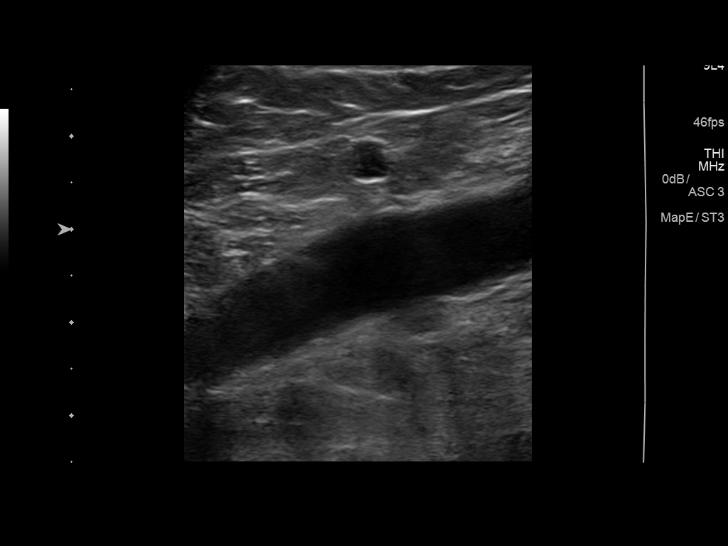
[im 13/50]
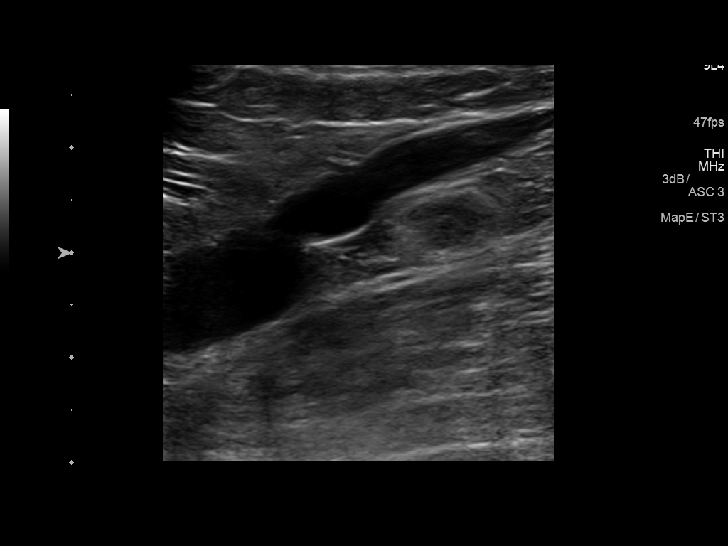
[im 18/50]
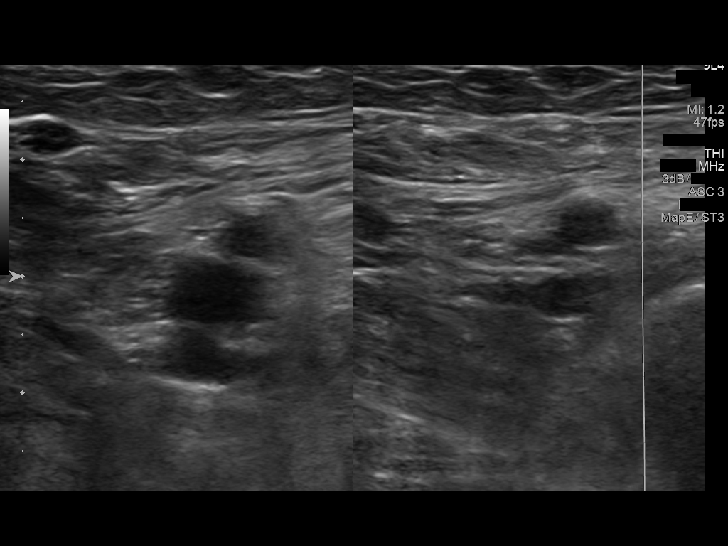
[im 22/50]
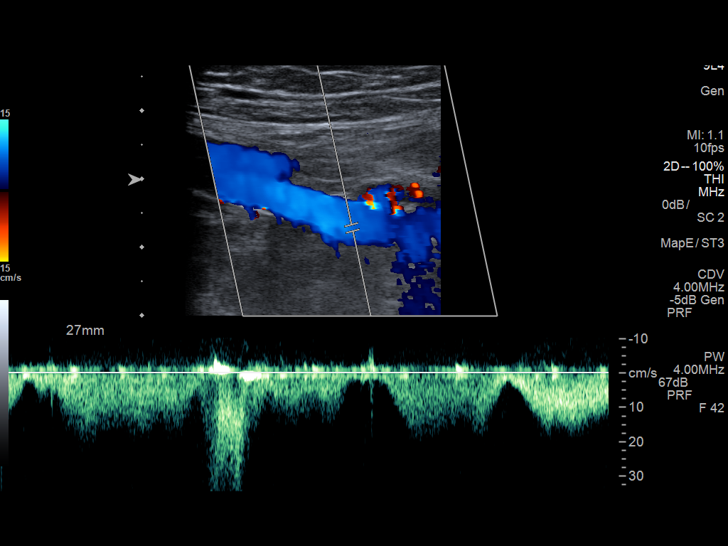
[im 26/50]
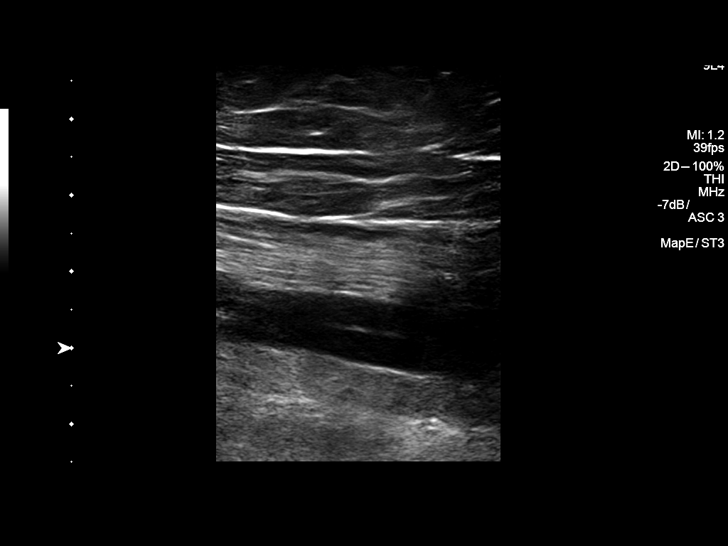
[im 28/50]
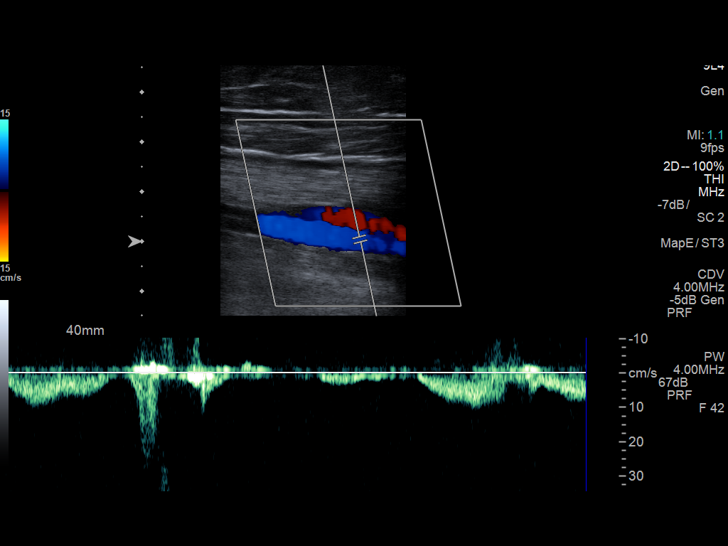
[im 32/50]
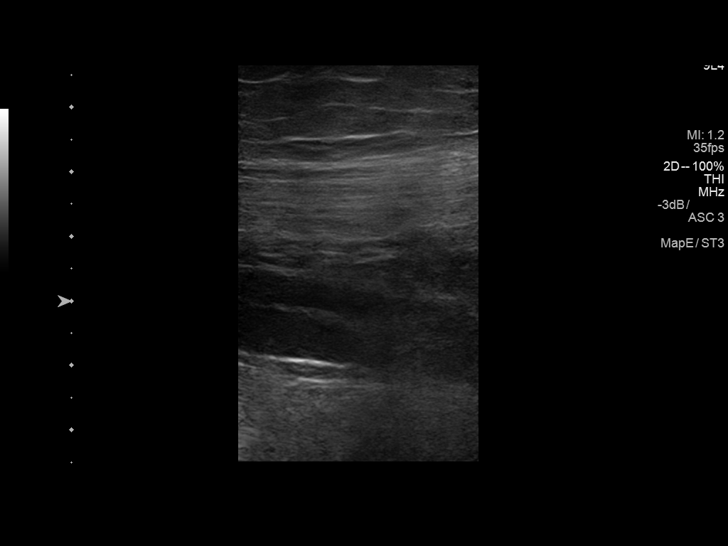
[im 37/50]
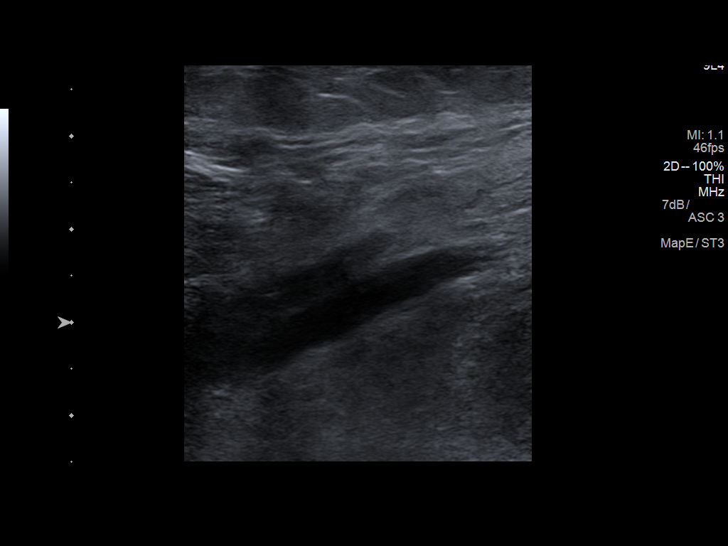
[im 41/50]
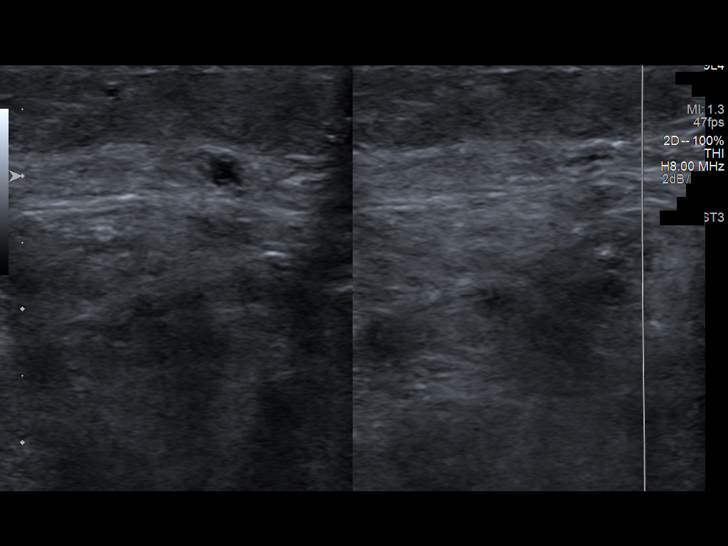
[im 45/50]
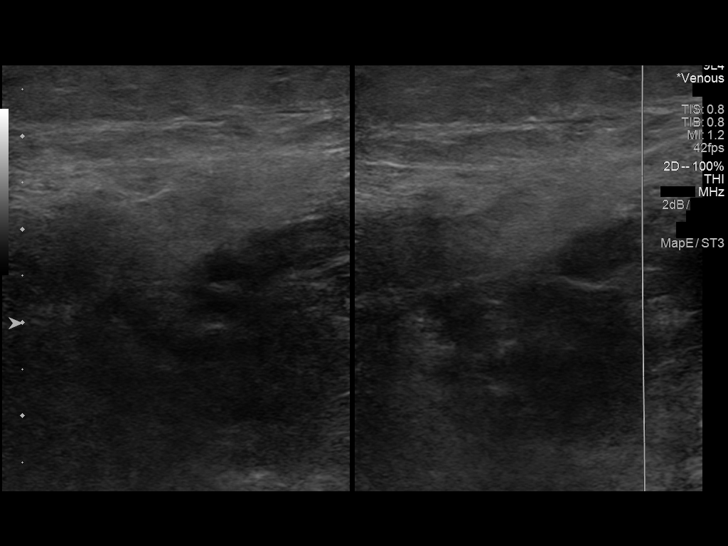
[im 50/50]
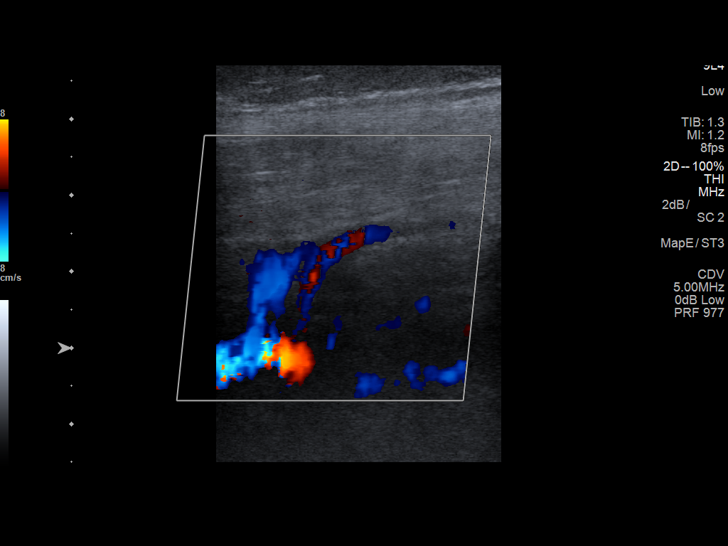

[13 of 24 positions shown; findings below may reference images not displayed]

FINDINGS: Contralateral Common Femoral Vein: Respiratory phasicity is normal
and symmetric with the symptomatic side. No evidence of thrombus.
Normal compressibility.

Common Femoral Vein: No evidence of thrombus. Normal
compressibility, respiratory phasicity and response to augmentation.

Saphenofemoral Junction: No evidence of thrombus. Normal
compressibility and flow on color Doppler imaging.

Profunda Femoral Vein: No evidence of thrombus. Normal
compressibility and flow on color Doppler imaging.

Femoral Vein: No evidence of thrombus. Normal compressibility,
respiratory phasicity and response to augmentation.

Popliteal Vein: No evidence of thrombus. Normal compressibility,
respiratory phasicity and response to augmentation.

Calf Veins: No evidence of thrombus. Normal compressibility and flow
on color Doppler imaging.

Superficial Great Saphenous Vein: No evidence of thrombus. Normal
compressibility.

Venous Reflux:  None.

Other Findings: No evidence of superficial thrombophlebitis or
abnormal fluid collection.
IMPRESSION: No evidence of left lower extremity deep venous thrombosis.

## 2021-08-01 ENCOUNTER — Encounter (INDEPENDENT_AMBULATORY_CARE_PROVIDER_SITE_OTHER): Payer: Self-pay | Admitting: *Deleted

## 2021-08-01 ENCOUNTER — Ambulatory Visit (INDEPENDENT_AMBULATORY_CARE_PROVIDER_SITE_OTHER): Payer: Medicare Other | Admitting: Gastroenterology

## 2021-08-29 ENCOUNTER — Encounter (INDEPENDENT_AMBULATORY_CARE_PROVIDER_SITE_OTHER): Payer: Self-pay | Admitting: *Deleted

## 2021-11-07 ENCOUNTER — Ambulatory Visit (INDEPENDENT_AMBULATORY_CARE_PROVIDER_SITE_OTHER): Payer: Medicare Other | Admitting: Gastroenterology

## 2021-11-07 ENCOUNTER — Encounter (INDEPENDENT_AMBULATORY_CARE_PROVIDER_SITE_OTHER): Payer: Self-pay | Admitting: Gastroenterology

## 2021-11-07 ENCOUNTER — Encounter (INDEPENDENT_AMBULATORY_CARE_PROVIDER_SITE_OTHER): Payer: Self-pay

## 2021-11-07 ENCOUNTER — Other Ambulatory Visit (INDEPENDENT_AMBULATORY_CARE_PROVIDER_SITE_OTHER): Payer: Self-pay

## 2021-11-07 VITALS — BP 132/84 | Temp 97.8°F | Ht 67.5 in | Wt 185.4 lb

## 2021-11-07 DIAGNOSIS — Z1211 Encounter for screening for malignant neoplasm of colon: Secondary | ICD-10-CM | POA: Diagnosis not present

## 2021-11-07 DIAGNOSIS — R1310 Dysphagia, unspecified: Secondary | ICD-10-CM | POA: Diagnosis not present

## 2021-11-07 DIAGNOSIS — I10 Essential (primary) hypertension: Secondary | ICD-10-CM

## 2021-11-07 DIAGNOSIS — Z01812 Encounter for preprocedural laboratory examination: Secondary | ICD-10-CM

## 2021-11-07 NOTE — Progress Notes (Signed)
Referring Provider: Shelby Dubin, FNP Primary Care Physician:  Shelby Dubin, FNP Primary GI Physician: new  Chief Complaint  Patient presents with   Dysphagia    Patient arrives with caregiver from group home [facility]. Having trouble choking on pills, food, and liquids.    HPI:   Jennifer Schultz is a 61 y.o. female with past medical history of autism, deafness, depression, HTN, HLD.   Patient presenting today as a new patient for dysphagia.   Caregiver present Jennifer Schultz, she provides all history for the patient. states that they cut patient's food up very small. There is always a staff member near her when she is eating. Recently patient has been coughing after eating like she is choking, states they have had to call EMS to come do a wellness check on the patient as she seemed to be choking. Denies any episodes of patient coughing anything back up. States that symptoms tend to occur with liquids and foods, occasionally will occur with pills. Yesterday patient had ice cream and also had some coughing after this. Usually occurs a few minutes after she eats. Reports that appetite is good. States that patient has been belching a lot recently which is new. States that patient is non verbal and deaf, she has been crying some lately which is not normal for her. Denies any issues with vomiting. No weight loss. No constipation or diarrhea noted by staff, has BM every day to every other day. Denies rectal bleeding or melena. Denies any recent changes in diet.   Caregiver also reports patient has had no previous colonoscopy, patient's sister feels it may be best to discuss colonoscopy once UGI issue is resolved as she is worried patient may not be able to tolerate more than one procedure at a time.   NSAID use: none Social hx: no etoh or tobacco  Fam hx: no CRC or liver disease.   Last Colonoscopy:never Last Endoscopy: never  Recommendations:   Past Medical History:  Diagnosis Date   Autism spectrum     Deaf    Depression    Essential hypertension    Hyperlipidemia    Idiopathic profound intellectual disability     Past Surgical History:  Procedure Laterality Date   BREAST REDUCTION SURGERY     KNEE SURGERY Left     Current Outpatient Medications  Medication Sig Dispense Refill   ammonium lactate (LAC-HYDRIN) 12 % lotion Two times daily     chlorhexidine (PERIDEX) 0.12 % solution Use as directed 10 mLs in the mouth or throat 2 times daily. Swish 1 to 2 minutes and expectorate     FLUoxetine (PROZAC) 10 MG tablet Take 10 mg by mouth daily. Take with the 20 mg tablet     FLUoxetine (PROZAC) 20 MG capsule Take 20 mg by mouth daily.      hydrochlorothiazide (HYDRODIURIL) 25 MG tablet Take by mouth daily.     lisinopril (ZESTRIL) 5 MG tablet Take 5 mg by mouth daily.     metoprolol tartrate (LOPRESSOR) 25 MG tablet Take 25 mg by mouth 2 (two) times daily.     pravastatin (PRAVACHOL) 80 MG tablet Take 80 mg by mouth daily.     selenium sulfide (SELSUN) 2.5 % shampoo Apply 1 application topically once a week.     ABILIFY 2 MG tablet Take 4 mg by mouth daily. (Patient not taking: Reported on 11/07/2021)     alprazolam (XANAX) 2 MG tablet Take 2 mg by mouth at bedtime  as needed for sleep. (Patient not taking: Reported on 11/07/2021)     alum & mag hydroxide-simeth (MAALOX/MYLANTA) 200-200-20 MG/5ML suspension Take 10 mLs by mouth as needed for indigestion or heartburn. (Patient not taking: Reported on 11/07/2021)     carbamide peroxide (DEBROX) 6.5 % OTIC solution Place 3-4 drops into both ears once a week. (Patient not taking: Reported on 11/07/2021)     ketoconazole (NIZORAL) 2 % cream Apply 1 application topically 2 (two) times daily. (Patient not taking: Reported on 12/27/2020)     levonorgestrel-ethinyl estradiol (NORDETTE) 0.15-30 MG-MCG tablet  (Patient not taking: Reported on 11/07/2021)     loperamide (IMODIUM) 2 MG capsule Take 4 mg by mouth as needed for diarrhea or loose stools.  (Patient not taking: Reported on 12/27/2020)     magnesium hydroxide (MILK OF MAGNESIA) 400 MG/5ML suspension Take 30 mLs by mouth as needed for mild constipation. (Patient not taking: Reported on 12/27/2020)     meloxicam (MOBIC) 7.5 MG tablet Take 7.5 mg by mouth daily.     urea (CARMOL) 40 % CREA Apply 1 application topically 2 (two) times daily. Apply to the callused areas (Patient not taking: Reported on 12/27/2020) 120 g 1   Vitamin D, Ergocalciferol, (DRISDOL) 50000 units CAPS capsule Take 50,000 Units by mouth every 7 (seven) days. ON SUNDAYS (Patient not taking: Reported on 12/27/2020)     No current facility-administered medications for this visit.    Allergies as of 11/07/2021   (No Known Allergies)    Family History  Family history unknown: Yes    Social History   Socioeconomic History   Marital status: Single    Spouse name: Not on file   Number of children: Not on file   Years of education: Not on file   Highest education level: Not on file  Occupational History   Not on file  Tobacco Use   Smoking status: Never   Smokeless tobacco: Never  Substance and Sexual Activity   Alcohol use: No   Drug use: No   Sexual activity: Not on file  Other Topics Concern   Not on file  Social History Narrative   Not on file   Social Determinants of Health   Financial Resource Strain: Not on file  Food Insecurity: Not on file  Transportation Needs: Not on file  Physical Activity: Not on file  Stress: Not on file  Social Connections: Not on file   Review of systems General: negative for malaise, night sweats, fever, chills, weight loss Neck: Negative for lumps, goiter, pain and significant neck swelling Resp: Negative for cough, wheezing, dyspnea at rest CV: Negative for chest pain, leg swelling, palpitations, orthopnea GI: denies melena, hematochezia, nausea, vomiting, diarrhea, constipation, odyonophagia, early satiety or unintentional weight loss. ?dysphagia  +coughing MSK: Negative for joint pain or swelling, back pain, and muscle pain. Derm: Negative for itching or rash Psych: Denies depression, anxiety, memory loss, confusion. No homicidal or suicidal ideation.  Heme: Negative for prolonged bleeding, bruising easily, and swollen nodes. Endocrine: Negative for cold or heat intolerance, polyuria, polydipsia and goiter. Neuro: negative for tremor, gait imbalance, syncope and seizures. The remainder of the review of systems is noncontributory.  Physical Exam: There were no vitals taken for this visit. General:  nonverbal. No distress noted. Pleasant and cooperative.  Head:  Normocephalic and atraumatic. Eyes:  Conjuctiva clear without scleral icterus. Mouth:  Oral mucosa pink and moist. Good dentition. No lesions. Heart: Normal rate and rhythm, s1 and s2 heart   sounds present.  Lungs: Clear lung sounds in all lobes. Respirations equal and unlabored. Abdomen:  +BS, soft, non-tender and non-distended. No rebound or guarding. No HSM or masses noted. Derm: No palmar erythema or jaundice Msk:  Symmetrical without gross deformities. Normal posture. Extremities:  Without edema. Neurologic:  Alert, non verbal.  Psych:  Alert and cooperative. Normal mood and affect.  Invalid input(s): 6 MONTHS   ASSESSMENT: AMYLAH WILL is a 61 y.o. female presenting today as a new patient for dysphagia.  Patient history provided by caregiver Jennifer Schultz, patient is non verbal and deaf, making history somewhat difficult, however, caregiver reports coughing after swallowing that occurs with different types of foods, even soft foods such as ice cream. Query some aspect of achalasia or esophageal spasms, however, cannot rule out esophageal stenosis, stricture, ring or web. Will proceed with EGD for further evaluation of symptoms. Indications, risks and benefits of procedure discussed in detail with patient. Patient's caregiver, Jennifer Schultz, verbalized understanding and is in  agreement for patient to proceed with EGD at this time.   no previous colonoscopy. Pt is of average CRC risk, denies any changes in patient's bowel habits, rectal bleeding or melena. patient's sister feels it may be best to discuss colonoscopy once UGI issue is resolved as she is worried patient may not be able to tolerate more than one procedure at a time given her cognitive and functional disabilities, which I feel is reasonable. will discuss possible CRC screening via colonoscopy at follow up visit. May consider cologuard as I'm concerned if patient will be able to complete prep.   PLAN:  Schedule EGD  2.  Continue with chewing precautions, small bites, sips of liquids between bites and encourage patient to chew thoroughly 3. Avoid very hot or very cold foods as these can trigger esophageal spasms 4. Discuss colonoscopy at next visit.  All questions were answered, patient's caregiver, Jennifer Schultz, verbalized understanding and is in agreement with plan as outlined above.    Follow Up: 4 months  Jennifer Schultz L. Jeanmarie Hubert, MSN, APRN, AGNP-C Adult-Gerontology Nurse Practitioner [facility]

## 2021-11-07 NOTE — H&P (View-Only) (Signed)
Referring Provider: Shelby Dubin, FNP Primary Care Physician:  Shelby Dubin, FNP Primary GI Physician: new  Chief Complaint  Patient presents with   Dysphagia    Patient arrives with caregiver from group home [facility]. Having trouble choking on pills, food, and liquids.    HPI:   Jennifer Schultz is a 61 y.o. female with past medical history of autism, deafness, depression, HTN, HLD.   Patient presenting today as a new patient for dysphagia.   Caregiver present Kara Mead, she provides all history for the patient. states that they cut patient's food up very small. There is always a staff member near her when she is eating. Recently patient has been coughing after eating like she is choking, states they have had to call EMS to come do a wellness check on the patient as she seemed to be choking. Denies any episodes of patient coughing anything back up. States that symptoms tend to occur with liquids and foods, occasionally will occur with pills. Yesterday patient had ice cream and also had some coughing after this. Usually occurs a few minutes after she eats. Reports that appetite is good. States that patient has been belching a lot recently which is new. States that patient is non verbal and deaf, she has been crying some lately which is not normal for her. Denies any issues with vomiting. No weight loss. No constipation or diarrhea noted by staff, has BM every day to every other day. Denies rectal bleeding or melena. Denies any recent changes in diet.   Caregiver also reports patient has had no previous colonoscopy, patient's sister feels it may be best to discuss colonoscopy once UGI issue is resolved as she is worried patient may not be able to tolerate more than one procedure at a time.   NSAID use: none Social hx: no etoh or tobacco  Fam hx: no CRC or liver disease.   Last Colonoscopy:never Last Endoscopy: never  Recommendations:   Past Medical History:  Diagnosis Date   Autism spectrum     Deaf    Depression    Essential hypertension    Hyperlipidemia    Idiopathic profound intellectual disability     Past Surgical History:  Procedure Laterality Date   BREAST REDUCTION SURGERY     KNEE SURGERY Left     Current Outpatient Medications  Medication Sig Dispense Refill   ammonium lactate (LAC-HYDRIN) 12 % lotion Two times daily     chlorhexidine (PERIDEX) 0.12 % solution Use as directed 10 mLs in the mouth or throat 2 times daily. Swish 1 to 2 minutes and expectorate     FLUoxetine (PROZAC) 10 MG tablet Take 10 mg by mouth daily. Take with the 20 mg tablet     FLUoxetine (PROZAC) 20 MG capsule Take 20 mg by mouth daily.      hydrochlorothiazide (HYDRODIURIL) 25 MG tablet Take by mouth daily.     lisinopril (ZESTRIL) 5 MG tablet Take 5 mg by mouth daily.     metoprolol tartrate (LOPRESSOR) 25 MG tablet Take 25 mg by mouth 2 (two) times daily.     pravastatin (PRAVACHOL) 80 MG tablet Take 80 mg by mouth daily.     selenium sulfide (SELSUN) 2.5 % shampoo Apply 1 application topically once a week.     ABILIFY 2 MG tablet Take 4 mg by mouth daily. (Patient not taking: Reported on 11/07/2021)     alprazolam (XANAX) 2 MG tablet Take 2 mg by mouth at bedtime  as needed for sleep. (Patient not taking: Reported on 11/07/2021)     alum & mag hydroxide-simeth (MAALOX/MYLANTA) 200-200-20 MG/5ML suspension Take 10 mLs by mouth as needed for indigestion or heartburn. (Patient not taking: Reported on 11/07/2021)     carbamide peroxide (DEBROX) 6.5 % OTIC solution Place 3-4 drops into both ears once a week. (Patient not taking: Reported on 11/07/2021)     ketoconazole (NIZORAL) 2 % cream Apply 1 application topically 2 (two) times daily. (Patient not taking: Reported on 12/27/2020)     levonorgestrel-ethinyl estradiol (NORDETTE) 0.15-30 MG-MCG tablet  (Patient not taking: Reported on 11/07/2021)     loperamide (IMODIUM) 2 MG capsule Take 4 mg by mouth as needed for diarrhea or loose stools.  (Patient not taking: Reported on 12/27/2020)     magnesium hydroxide (MILK OF MAGNESIA) 400 MG/5ML suspension Take 30 mLs by mouth as needed for mild constipation. (Patient not taking: Reported on 12/27/2020)     meloxicam (MOBIC) 7.5 MG tablet Take 7.5 mg by mouth daily.     urea (CARMOL) 40 % CREA Apply 1 application topically 2 (two) times daily. Apply to the callused areas (Patient not taking: Reported on 12/27/2020) 120 g 1   Vitamin D, Ergocalciferol, (DRISDOL) 50000 units CAPS capsule Take 50,000 Units by mouth every 7 (seven) days. ON SUNDAYS (Patient not taking: Reported on 12/27/2020)     No current facility-administered medications for this visit.    Allergies as of 11/07/2021   (No Known Allergies)    Family History  Family history unknown: Yes    Social History   Socioeconomic History   Marital status: Single    Spouse name: Not on file   Number of children: Not on file   Years of education: Not on file   Highest education level: Not on file  Occupational History   Not on file  Tobacco Use   Smoking status: Never   Smokeless tobacco: Never  Substance and Sexual Activity   Alcohol use: No   Drug use: No   Sexual activity: Not on file  Other Topics Concern   Not on file  Social History Narrative   Not on file   Social Determinants of Health   Financial Resource Strain: Not on file  Food Insecurity: Not on file  Transportation Needs: Not on file  Physical Activity: Not on file  Stress: Not on file  Social Connections: Not on file   Review of systems General: negative for malaise, night sweats, fever, chills, weight loss Neck: Negative for lumps, goiter, pain and significant neck swelling Resp: Negative for cough, wheezing, dyspnea at rest CV: Negative for chest pain, leg swelling, palpitations, orthopnea GI: denies melena, hematochezia, nausea, vomiting, diarrhea, constipation, odyonophagia, early satiety or unintentional weight loss. ?dysphagia  +coughing MSK: Negative for joint pain or swelling, back pain, and muscle pain. Derm: Negative for itching or rash Psych: Denies depression, anxiety, memory loss, confusion. No homicidal or suicidal ideation.  Heme: Negative for prolonged bleeding, bruising easily, and swollen nodes. Endocrine: Negative for cold or heat intolerance, polyuria, polydipsia and goiter. Neuro: negative for tremor, gait imbalance, syncope and seizures. The remainder of the review of systems is noncontributory.  Physical Exam: There were no vitals taken for this visit. General:  nonverbal. No distress noted. Pleasant and cooperative.  Head:  Normocephalic and atraumatic. Eyes:  Conjuctiva clear without scleral icterus. Mouth:  Oral mucosa pink and moist. Good dentition. No lesions. Heart: Normal rate and rhythm, s1 and s2 heart  sounds present.  Lungs: Clear lung sounds in all lobes. Respirations equal and unlabored. Abdomen:  +BS, soft, non-tender and non-distended. No rebound or guarding. No HSM or masses noted. Derm: No palmar erythema or jaundice Msk:  Symmetrical without gross deformities. Normal posture. Extremities:  Without edema. Neurologic:  Alert, non verbal.  Psych:  Alert and cooperative. Normal mood and affect.  Invalid input(s): 6 MONTHS   ASSESSMENT: AMYLAH WILL is a 61 y.o. female presenting today as a new patient for dysphagia.  Patient history provided by caregiver Kara Mead, patient is non verbal and deaf, making history somewhat difficult, however, caregiver reports coughing after swallowing that occurs with different types of foods, even soft foods such as ice cream. Query some aspect of achalasia or esophageal spasms, however, cannot rule out esophageal stenosis, stricture, ring or web. Will proceed with EGD for further evaluation of symptoms. Indications, risks and benefits of procedure discussed in detail with patient. Patient's caregiver, Kara Mead, verbalized understanding and is in  agreement for patient to proceed with EGD at this time.   no previous colonoscopy. Pt is of average CRC risk, denies any changes in patient's bowel habits, rectal bleeding or melena. patient's sister feels it may be best to discuss colonoscopy once UGI issue is resolved as she is worried patient may not be able to tolerate more than one procedure at a time given her cognitive and functional disabilities, which I feel is reasonable. will discuss possible CRC screening via colonoscopy at follow up visit. May consider cologuard as I'm concerned if patient will be able to complete prep.   PLAN:  Schedule EGD  2.  Continue with chewing precautions, small bites, sips of liquids between bites and encourage patient to chew thoroughly 3. Avoid very hot or very cold foods as these can trigger esophageal spasms 4. Discuss colonoscopy at next visit.  All questions were answered, patient's caregiver, Kara Mead, verbalized understanding and is in agreement with plan as outlined above.    Follow Up: 4 months  Namiah Dunnavant L. Jeanmarie Hubert, MSN, APRN, AGNP-C Adult-Gerontology Nurse Practitioner [facility]

## 2021-11-07 NOTE — Patient Instructions (Signed)
We will get Jennifer Schultz set up for upper endoscopy for further evaluation of her swallowing difficulty. In the meantime, please continue to provide small bites, with sips of liquids between bites and encourage her to chew thoroughly. Please also try to avoid very cold or very hot foods/liquids as these can sometimes elicit esophageal spasms which can also cause issues with swallowing and pain. Try to avoid thicker/dryer foods such as meats and breads as well until EGD has been done.  We will discuss possibility of proceeding with colon cancer screening via colonoscopy at next visit  Follow up 4 months

## 2021-11-15 ENCOUNTER — Other Ambulatory Visit (HOSPITAL_COMMUNITY)
Admission: RE | Admit: 2021-11-15 | Discharge: 2021-11-15 | Disposition: A | Discharge status: Home / Self Care | Payer: Medicare Other | Source: Ambulatory Visit | Attending: Gastroenterology | Admitting: Gastroenterology

## 2021-11-15 DIAGNOSIS — I10 Essential (primary) hypertension: Secondary | ICD-10-CM | POA: Diagnosis present

## 2021-11-15 DIAGNOSIS — Z01812 Encounter for preprocedural laboratory examination: Secondary | ICD-10-CM | POA: Diagnosis present

## 2021-11-15 LAB — BASIC METABOLIC PANEL
Anion gap: 6 (ref 5–15)
BUN: 15 mg/dL (ref 6–20)
CO2: 23 mmol/L (ref 22–32)
Calcium: 9.1 mg/dL (ref 8.9–10.3)
Chloride: 107 mmol/L (ref 98–111)
Creatinine, Ser: 0.66 mg/dL (ref 0.44–1.00)
GFR, Estimated: >60 mL/min (ref >60)
Glucose, Bld: 104 mg/dL — ABNORMAL HIGH (ref 70–99)
Potassium: 4.6 mmol/L (ref 3.5–5.1)
Sodium: 136 mmol/L (ref 135–145)

## 2021-11-16 ENCOUNTER — Encounter (HOSPITAL_COMMUNITY): Payer: Self-pay | Admitting: Gastroenterology

## 2021-11-16 ENCOUNTER — Other Ambulatory Visit (INDEPENDENT_AMBULATORY_CARE_PROVIDER_SITE_OTHER): Payer: Self-pay

## 2021-11-16 ENCOUNTER — Other Ambulatory Visit: Payer: Self-pay

## 2021-11-16 ENCOUNTER — Ambulatory Visit (HOSPITAL_COMMUNITY): Payer: Medicare Other | Admitting: Anesthesiology

## 2021-11-16 ENCOUNTER — Ambulatory Visit (HOSPITAL_BASED_OUTPATIENT_CLINIC_OR_DEPARTMENT_OTHER): Payer: Medicare Other | Admitting: Anesthesiology

## 2021-11-16 ENCOUNTER — Encounter (HOSPITAL_COMMUNITY)
Admission: RE | Disposition: A | Discharge status: Nursing Home (Includes ICF) | Payer: Self-pay | Source: Ambulatory Visit | Attending: Gastroenterology

## 2021-11-16 ENCOUNTER — Telehealth (INDEPENDENT_AMBULATORY_CARE_PROVIDER_SITE_OTHER): Payer: Self-pay

## 2021-11-16 ENCOUNTER — Ambulatory Visit (HOSPITAL_COMMUNITY)
Admission: RE | Admit: 2021-11-16 | Discharge: 2021-11-16 | Disposition: A | Discharge status: Nursing Home (Includes ICF) | Payer: Medicare Other | Source: Ambulatory Visit | Attending: Gastroenterology | Admitting: Gastroenterology

## 2021-11-16 DIAGNOSIS — E785 Hyperlipidemia, unspecified: Secondary | ICD-10-CM | POA: Insufficient documentation

## 2021-11-16 DIAGNOSIS — I10 Essential (primary) hypertension: Secondary | ICD-10-CM | POA: Insufficient documentation

## 2021-11-16 DIAGNOSIS — R1310 Dysphagia, unspecified: Secondary | ICD-10-CM | POA: Insufficient documentation

## 2021-11-16 DIAGNOSIS — H9190 Unspecified hearing loss, unspecified ear: Secondary | ICD-10-CM | POA: Diagnosis not present

## 2021-11-16 DIAGNOSIS — F32A Depression, unspecified: Secondary | ICD-10-CM | POA: Diagnosis not present

## 2021-11-16 DIAGNOSIS — K3189 Other diseases of stomach and duodenum: Secondary | ICD-10-CM

## 2021-11-16 DIAGNOSIS — K224 Dyskinesia of esophagus: Secondary | ICD-10-CM

## 2021-11-16 DIAGNOSIS — K389 Disease of appendix, unspecified: Secondary | ICD-10-CM | POA: Diagnosis not present

## 2021-11-16 DIAGNOSIS — R6339 Other feeding difficulties: Secondary | ICD-10-CM

## 2021-11-16 DIAGNOSIS — F840 Autistic disorder: Secondary | ICD-10-CM | POA: Diagnosis not present

## 2021-11-16 DIAGNOSIS — R4701 Aphasia: Secondary | ICD-10-CM

## 2021-11-16 DIAGNOSIS — R1319 Other dysphagia: Secondary | ICD-10-CM

## 2021-11-16 HISTORY — PX: ESOPHAGOGASTRODUODENOSCOPY (EGD) WITH PROPOFOL: SHX5813

## 2021-11-16 HISTORY — PX: SAVORY DILATION: SHX5439

## 2021-11-16 HISTORY — PX: BIOPSY: SHX5522

## 2021-11-16 SURGERY — ESOPHAGOGASTRODUODENOSCOPY (EGD) WITH PROPOFOL
Anesthesia: General

## 2021-11-16 MED ORDER — LIDOCAINE 2% (20 MG/ML) 5 ML SYRINGE
INTRAMUSCULAR | Status: DC | PRN
  Administered 2021-11-16: 50 mg via INTRAVENOUS

## 2021-11-16 MED ORDER — PROPOFOL 10 MG/ML IV BOLUS
INTRAVENOUS | Status: DC | PRN
  Administered 2021-11-16: 20 mg via INTRAVENOUS
  Administered 2021-11-16: 30 mg via INTRAVENOUS
  Administered 2021-11-16: 40 mg via INTRAVENOUS
  Administered 2021-11-16: 90 mg via INTRAVENOUS
  Administered 2021-11-16: 30 mg via INTRAVENOUS
  Administered 2021-11-16: 90 mg via INTRAVENOUS

## 2021-11-16 MED ORDER — LACTATED RINGERS IV SOLN
INTRAVENOUS | Status: DC
  Administered 2021-11-16: 1000 mL via INTRAVENOUS

## 2021-11-16 NOTE — Discharge Instructions (Signed)
You are being discharged to home.  Resume your previous diet.  We are waiting for your pathology results.  Return to your GI clinic in three months.  Schedule barium esophagram and MBS .

## 2021-11-16 NOTE — Anesthesia Preprocedure Evaluation (Signed)
Anesthesia Evaluation  Patient identified by MRN, date of birth, ID band Patient awake    Reviewed: Allergy & Precautions, H&P , NPO status , Patient's Chart, lab work & pertinent test results, reviewed documented beta blocker date and time   Airway Mallampati: II  TM Distance: >3 FB Neck ROM: full    Dental no notable dental hx.    Pulmonary neg pulmonary ROS,    Pulmonary exam normal breath sounds clear to auscultation       Cardiovascular Exercise Tolerance: Good hypertension, negative cardio ROS   Rhythm:regular Rate:Normal     Neuro/Psych PSYCHIATRIC DISORDERS Depression negative neurological ROS     GI/Hepatic negative GI ROS, Neg liver ROS,   Endo/Other  negative endocrine ROS  Renal/GU negative Renal ROS  negative genitourinary   Musculoskeletal   Abdominal   Peds  Hematology negative hematology ROS (+)   Anesthesia Other Findings   Reproductive/Obstetrics negative OB ROS                             Anesthesia Physical Anesthesia Plan  ASA: 3  Anesthesia Plan: General   Post-op Pain Management:    Induction:   PONV Risk Score and Plan: Propofol infusion  Airway Management Planned:   Additional Equipment:   Intra-op Plan:   Post-operative Plan:   Informed Consent: I have reviewed the patients History and Physical, chart, labs and discussed the procedure including the risks, benefits and alternatives for the proposed anesthesia with the patient or authorized representative who has indicated his/her understanding and acceptance.     Dental Advisory Given  Plan Discussed with: CRNA  Anesthesia Plan Comments:         Anesthesia Quick Evaluation

## 2021-11-16 NOTE — Telephone Encounter (Signed)
-----   Message from Dolores Frame, MD sent at 11/16/2021  3:50 PM EDT ----- Yes please ----- Message ----- From: Leota Sauers, CMA Sent: 11/16/2021   3:20 PM EDT To: Dolores Frame, MD  Do you want this with speech pathology?  ----- Message ----- From: Dolores Frame, MD Sent: 11/16/2021   2:53 PM EDT To: Leota Sauers, CMA  Hi Soledad Gerlach,   Can you please schedule a barium esophagram and a modified barium swallow? Dx: dysphagia.   Thanks,  Katrinka Blazing, MD Gastroenterology and Hepatology [facility] \

## 2021-11-16 NOTE — Telephone Encounter (Signed)
Noted Order has been placed and referral has been made they will contact Jennifer Schultz to schedule

## 2021-11-16 NOTE — Op Note (Signed)
[facility] Patient Name: Jennifer Schultz Procedure Date: 11/16/2021 2:31 PM MRN: [id number] Date of Birth: 1960-12-29 Attending MD: Katrinka Blazing ,  CSN: [id number] Age: 61 Admit Type: Outpatient Procedure:                Upper GI endoscopy Indications:              Dysphagia Providers:                Katrinka Blazing, Sheran Fava, Ina Homes,                            Technician Referring MD:              Medicines:                Monitored Anesthesia Care Complications:            No immediate complications. Estimated Blood Loss:     Estimated blood loss: none. Procedure:                Pre-Anesthesia Assessment:                           - Prior to the procedure, a History and Physical                            was performed, and patient medications, allergies                            and sensitivities were reviewed. The patient's                            tolerance of previous anesthesia was reviewed.                           - The risks and benefits of the procedure and the                            sedation options and risks were discussed with the                            patient. All questions were answered and informed                            consent was obtained.                           - ASA Grade Assessment: II - A patient with mild                            systemic disease.                           After obtaining informed consent, the endoscope was                            passed under direct vision. Throughout the  procedure, the patient's blood pressure, pulse, and                            oxygen saturations were monitored continuously. The                            [id number]-H190 ([id number]) scope was introduced through the                            mouth, and advanced to the second part of duodenum.                            The upper GI endoscopy was accomplished without                             difficulty. The patient tolerated the procedure                            well. Scope In: 2:32:03 PM Scope Out: 2:42:14 PM Total Procedure Duration: 0 hours 10 minutes 11 seconds  Findings:      Abnormal motility was noted in the lower third of the esophagus. There       are extra peristaltic waves in the esophageal body. The distal       esophagus/lower esophageal sphincter is spastic, but gives up passage to       the endoscope. A guidewire was placed and the scope was withdrawn.       Dilation was performed with a Savary dilator with no resistance at 18       mm. No mucosal disruption was seen upon reinspection.      The entire examined stomach was normal.      Diffuse mucosal flattening was found in the entire examined duodenum.       Biopsies were taken with a cold forceps for histology. Impression:               - Abnormal esophageal motility. Dilated.                           - Normal stomach.                           - Flattened mucosa was found in the duodenum.                            Biopsied. Moderate Sedation:      Per Anesthesia Care Recommendation:           - Discharge patient to home (ambulatory).                           - Resume previous diet.                           - Await pathology results.                           - Return to GI clinic in 3 months.                           -  Schedule barium esophagram and MBS Procedure Code(s):        --- Professional ---                           [phone], Esophagogastroduodenoscopy, flexible,                            transoral; with insertion of guide wire followed by                            passage of dilator(s) through esophagus over guide                            wire                           [id number], 59, Esophagogastroduodenoscopy, flexible,                            transoral; with biopsy, single or multiple Diagnosis Code(s):        --- Professional ---                           K22.4, Dyskinesia of  esophagus                           K31.89, Other diseases of stomach and duodenum                           R13.10, Dysphagia, unspecified CPT copyright 2019 American Medical Association. All rights reserved. The codes documented in this report are preliminary and upon coder review may  be revised to meet current compliance requirements. Katrinka Blazinganiel Castaneda, MD Katrinka Blazinganiel Castaneda,  11/16/2021 2:51:40 PM This report has been signed electronically. Number of Addenda: 0

## 2021-11-16 NOTE — Interval H&P Note (Signed)
History and Physical Interval Note:  11/16/2021 1:15 PM  Jennifer Schultz  has presented today for surgery, with the diagnosis of Dysphagia.  The various methods of treatment have been discussed with the patient and family. After consideration of risks, benefits and other options for treatment, the patient has consented to  Procedure(s) with comments: ESOPHAGOGASTRODUODENOSCOPY (EGD) WITH PROPOFOL (N/A) - 220 as a surgical intervention.  The patient's history has been reviewed, patient examined, no change in status, stable for surgery.  I have reviewed the patient's chart and labs.  Questions were answered to the patient's satisfaction.     Katrinka Blazing Mayorga

## 2021-11-16 NOTE — Transfer of Care (Signed)
Immediate Anesthesia Transfer of Care Note  Patient: Jennifer Schultz  Procedure(s) Performed: ESOPHAGOGASTRODUODENOSCOPY (EGD) WITH PROPOFOL SAVORY DILATION BIOPSY  Patient Location: Endoscopy Unit  Anesthesia Type:MAC  Level of Consciousness: sedated, patient cooperative and responds to stimulation  Airway & Oxygen Therapy: Patient Spontanous Breathing  Post-op Assessment: Report given to RN, Post -op Vital signs reviewed and stable and Patient moving all extremities  Post vital signs: Reviewed and stable  Last Vitals:  Vitals Value Taken Time  BP 112/80 11/16/21 1447  Temp    Pulse 65 11/16/21 1447  Resp 18 11/16/21 1447  SpO2 97 % 11/16/21 1447    Last Pain:  Vitals:   11/16/21 1447  TempSrc: Oral  PainSc:          Complications: No notable events documented.

## 2021-11-16 NOTE — Anesthesia Postprocedure Evaluation (Signed)
Anesthesia Post Note  Patient: Jennifer Schultz  Procedure(s) Performed: ESOPHAGOGASTRODUODENOSCOPY (EGD) WITH PROPOFOL SAVORY DILATION BIOPSY  Patient location during evaluation: Phase II Anesthesia Type: General Level of consciousness: awake Pain management: pain level controlled Vital Signs Assessment: post-procedure vital signs reviewed and stable Respiratory status: spontaneous breathing and respiratory function stable Cardiovascular status: blood pressure returned to baseline and stable Postop Assessment: no headache and no apparent nausea or vomiting Anesthetic complications: no Comments: Late entry   No notable events documented.   Last Vitals:  Vitals:   11/16/21 1447 11/16/21 1448  BP: 112/80   Pulse: 65 67  Resp: 18   Temp:    SpO2: 97%     Last Pain:  Vitals:   11/16/21 1447  TempSrc: Oral  PainSc:                  Louann Sjogren

## 2021-11-18 LAB — SURGICAL PATHOLOGY

## 2021-11-21 ENCOUNTER — Encounter (INDEPENDENT_AMBULATORY_CARE_PROVIDER_SITE_OTHER): Payer: Self-pay | Admitting: *Deleted

## 2021-11-23 ENCOUNTER — Encounter (HOSPITAL_COMMUNITY): Payer: Self-pay | Admitting: Gastroenterology

## 2021-11-30 ENCOUNTER — Other Ambulatory Visit (HOSPITAL_COMMUNITY): Payer: Self-pay | Admitting: Specialist

## 2021-11-30 DIAGNOSIS — R1312 Dysphagia, oropharyngeal phase: Secondary | ICD-10-CM

## 2021-11-30 DIAGNOSIS — R633 Feeding difficulties, unspecified: Secondary | ICD-10-CM

## 2021-11-30 DIAGNOSIS — R4701 Aphasia: Secondary | ICD-10-CM

## 2021-12-08 ENCOUNTER — Ambulatory Visit (HOSPITAL_COMMUNITY): Payer: Medicare Other | Admitting: Speech Pathology

## 2021-12-15 ENCOUNTER — Other Ambulatory Visit (INDEPENDENT_AMBULATORY_CARE_PROVIDER_SITE_OTHER): Payer: Self-pay | Admitting: Gastroenterology

## 2021-12-15 ENCOUNTER — Encounter (HOSPITAL_COMMUNITY): Payer: Self-pay | Admitting: Speech Pathology

## 2021-12-15 ENCOUNTER — Ambulatory Visit (HOSPITAL_COMMUNITY): Payer: Medicare Other | Attending: Gastroenterology | Admitting: Speech Pathology

## 2021-12-15 ENCOUNTER — Ambulatory Visit (HOSPITAL_COMMUNITY)
Admission: RE | Admit: 2021-12-15 | Discharge: 2021-12-15 | Disposition: A | Discharge status: Home / Self Care | Payer: Medicare Other | Source: Ambulatory Visit | Attending: Gastroenterology | Admitting: Gastroenterology

## 2021-12-15 DIAGNOSIS — R4701 Aphasia: Secondary | ICD-10-CM | POA: Diagnosis present

## 2021-12-15 DIAGNOSIS — R1312 Dysphagia, oropharyngeal phase: Secondary | ICD-10-CM | POA: Diagnosis present

## 2021-12-15 DIAGNOSIS — R6339 Other feeding difficulties: Secondary | ICD-10-CM | POA: Insufficient documentation

## 2021-12-15 DIAGNOSIS — R1310 Dysphagia, unspecified: Secondary | ICD-10-CM

## 2021-12-15 NOTE — Therapy (Signed)
[facility] [address] [place], [place], [address] Phone: [phone]   Fax:  [phone]  Modified Barium Swallow  Patient Details  Name: Jennifer Schultz MRN: [id number] Date of Birth: 1960/11/18 No data recorded  Encounter Date: 12/15/2021   End of Session - 12/15/21 1358     Visit Number 1    Number of Visits 1    Authorization Type Medicare    SLP Start Time 1150    SLP Stop Time  1220    SLP Time Calculation (min) 30 min    Activity Tolerance Patient tolerated treatment well             Past Medical History:  Diagnosis Date   Autism spectrum    Deaf    Depression    Essential hypertension    Hyperlipidemia    Idiopathic profound intellectual disability     Past Surgical History:  Procedure Laterality Date   BIOPSY  11/16/2021   Procedure: BIOPSY;  Surgeon: Dolores Frame, MD;  Location: [facility];  Service: Gastroenterology;;   BREAST REDUCTION SURGERY     ESOPHAGOGASTRODUODENOSCOPY (EGD) WITH PROPOFOL N/A 11/16/2021   Procedure: ESOPHAGOGASTRODUODENOSCOPY (EGD) WITH PROPOFOL;  Surgeon: Dolores Frame, MD;  Location: [facility];  Service: Gastroenterology;  Laterality: N/A;  220   KNEE SURGERY Left    SAVORY DILATION  11/16/2021   Procedure: SAVORY DILATION;  Surgeon: Marguerita Merles, Reuel Boom, MD;  Location: [facility];  Service: Gastroenterology;;    There were no vitals filed for this visit.   Subjective Assessment - 12/15/21 1345     Subjective "She starts coughing after she eats."    Patient is accompained by: --   caregiver from [facility]   Special Tests MBSS    Currently in Pain? No/denies                 General - 12/15/21 1347       General Information   Date of Onset 11/16/21    HPI Jennifer Schultz is a 61 y.o. female with past medical history of autism, deafness, depression, HTN, HLD. Pt is nonverbal per caregiver. Caregiver present Jennifer Schultz, she  provides all history for the patient. states that they cut patient's food up very small. There is always a staff member near her when she is eating. Recently patient has been coughing after eating like she is choking, states they have had to call EMS to come do a wellness check on the patient as she seemed to be choking. Denies any episodes of patient coughing anything back up. States that symptoms tend to occur with liquids and foods, occasionally will occur with pills. Usually occurs a few minutes after she eats. Pt underwent EGD at the end of May.    Type of Study MBS-Modified Barium Swallow Study    Previous Swallow Assessment EGD May 2023    Diet Prior to this Study Dysphagia 3 (soft);Thin liquids    Temperature Spikes Noted No    Respiratory Status Room air    History of Recent Intubation No    Behavior/Cognition Alert;Cooperative;Pleasant mood    Oral Cavity Assessment Within Functional Limits    Oral Care Completed by SLP No    Oral Cavity - Dentition Adequate natural dentition    Vision Functional for self feeding    Self-Feeding Abilities Able to feed self;Needs set up    Patient Positioning Upright in chair    Baseline Vocal  Quality Not observed   Pt is nonverbal   Volitional Cough Cognitively unable to elicit    Volitional Swallow Unable to elicit    Anatomy Within functional limits    Pharyngeal Secretions Not observed secondary MBS                Oral Preparation/Oral Phase - 12/15/21 1349       Oral Preparation/Oral Phase   Oral Phase Impaired      Oral - Thin   Oral - Thin Teaspoon Within functional limits    Oral - Thin Cup Within functional limits    Oral - Thin Straw Within functional limits      Oral - Solids   Oral - Puree Within functional limits    Oral - Regular Within functional limits    Oral - Pill Other (Comment)   Pt moved pill to valleculae before taking a sip of liquid     Electrical stimulation - Oral Phase   Was Electrical Stimulation Used  No              Pharyngeal Phase - 12/15/21 1351       Pharyngeal Phase   Pharyngeal Phase Impaired      Pharyngeal - Thin   Pharyngeal- Thin Teaspoon Within functional limits    Pharyngeal- Thin Cup Pharyngeal residue - valleculae;Lateral channel residue   pooling after the swallow due to Pt taking LARGE sip, repeat swallow clears   Pharyngeal- Thin Straw Within functional limits      Pharyngeal - Solids   Pharyngeal- Puree Within functional limits    Pharyngeal- Regular Reduced laryngeal elevation;Reduced tongue base retraction;Pharyngeal residue - valleculae    Pharyngeal- Pill --   brief stasis of pill in the valleculae     Electrical Stimulation - Pharyngeal Phase   Was Electrical Stimulation Used No              Cricopharyngeal Phase - 12/15/21 1357       Cervical Esophageal Phase   Cervical Esophageal Phase Impaired      Cervical Esophageal Phase - Solids   Pill Prominent cricopharyngeal segment      Cervical Esophageal Phase - Comment   Other Esophageal Phase Observations Sweep was unremarkable, bolus transit complete              Plan - 12/15/21 1359     Clinical Impression Statement Pt presents with mild cognitive based dysphagia with impulsivity noted with intake rate and amount. Pt with developmental cognitive delays and is reportedly deaf and nonverbal, so she was unable to follow directions to take small sips. As a result, she drank the entire 2 oz thin barium in one sip which resulted in flash penetration and residuals in the valleculae and lateral channels post swallow. These were cleared with spontaneous repeat/dry swallow. Pt with min decreased laryngeal elevation and tongue base retraction when swallowing solids which resulted in mi/mod vallecular residue post swallow, which cleared with repeat swallow of liquid wash. Pt was given the barium tablet and cup of thin barium, however she appeared to try to swallow the pill without the liquid. When  the imaging was turned on, the tablet was sitting in the valleculae. She then drank the thin barium and cleared both without incident. Pt was noted to have a prominent cricopharyngeus (CP bar) when she swallowed the barium tablet, however this did not appear to impact swallow during this study. Given cognitive impairment and reports of Pt coughing after meals/solids, it  is possible that some solids are not masticated well and remain in valleculae and/or intermittent problems due to CP bar. Recommend mechanical soft textures and thin liquids with visual cues to decrease rate and amount and also provide liquid wash after solids (alternate solids with a  sip of liquid). These recommendations were reviewed with caregiver and they were given my contact information should they have further questions.             Patient will benefit from skilled therapeutic intervention in order to improve the following deficits and impairments:   Oropharyngeal dysphagia     Recommendations/Treatment - 12/15/21 1357       Swallow Evaluation Recommendations   SLP Diet Recommendations Thin;Dysphagia 3 (mechanical soft)    Liquid Administration via Cup;Straw    Medication Administration Whole meds with puree    Supervision Patient able to self feed;Full supervision/cueing for compensatory strategies    Compensations Slow rate;Small sips/bites;Follow solids with liquid    Postural Changes Seated upright at 90 degrees;Remain upright for at least 30 minutes after feeds/meals               Problem List Patient Active Problem List   Diagnosis Date Noted   Dysphagia 11/07/2021   Essential hypertension 03/07/2017   Hyperlipidemia 03/07/2017   Intellectual disability 03/07/2017   Autism spectrum disorder 03/07/2017   Expressive aphasia 03/07/2017   Encounter for screening colonoscopy 01/31/2017   Menstrual periods irregular 01/31/2017   Thank you,  Jennifer Schultz, Jennifer Schultz [phone]  Jennifer Schultz,  Jennifer Schultz 12/15/2021, 2:00 PM  [facility] [address] [place], [place], [address] Phone: [phone]   Fax:  [phone]  Name: Jennifer Schultz MRN: [id number] Date of Birth: 07/06/1960

## 2022-07-27 ENCOUNTER — Ambulatory Visit (INDEPENDENT_AMBULATORY_CARE_PROVIDER_SITE_OTHER): Payer: Medicare Other | Admitting: Gastroenterology

## 2022-08-10 ENCOUNTER — Ambulatory Visit (INDEPENDENT_AMBULATORY_CARE_PROVIDER_SITE_OTHER): Payer: Medicare Other | Admitting: Gastroenterology

## 2022-08-10 ENCOUNTER — Encounter (INDEPENDENT_AMBULATORY_CARE_PROVIDER_SITE_OTHER): Payer: Self-pay | Admitting: Gastroenterology

## 2022-09-11 ENCOUNTER — Ambulatory Visit (INDEPENDENT_AMBULATORY_CARE_PROVIDER_SITE_OTHER): Payer: Medicare Other | Admitting: Gastroenterology

## 2023-03-27 ENCOUNTER — Ambulatory Visit: Payer: Medicare Other | Attending: Family Medicine | Admitting: Audiologist

## 2023-03-27 DIAGNOSIS — H9192 Unspecified hearing loss, left ear: Secondary | ICD-10-CM | POA: Insufficient documentation

## 2023-03-27 DIAGNOSIS — H918X1 Other specified hearing loss, right ear: Secondary | ICD-10-CM | POA: Diagnosis present

## 2023-03-27 NOTE — Procedures (Signed)
  Outpatient Audiology and [facility] [address] [place], [place]  [address] [phone]  AUDIOLOGICAL  EVALUATION  NAME: Jennifer Schultz     DOB:   [birth year]      MRN: [id number]                                                                                     DATE: 03/27/2023     REFERENT: Shelby Dubin, FNP STATUS: Outpatient DIAGNOSIS: Unspecified hearing loss bilaterally  History: Terran was seen for an audiological evaluation due to concern that she does not hear at all and uses sign language to communicate.  Aneeka was brought today by her caretakers from her group home.  They said that to communicate with her they use sign language, however they think she only knows a couple signs.  They are unaware of any previous hearing testing, cochlear implant use, or hearing aids.  Halima does not speak verbally. [facility] and no [facility] were available for review.  A sign language interpreter was used over an iPad to see if Mikyla could communicate through sign with audiologist.  Steward Drone poked at iPad and was making hand movements and gestures around her face.  The sign language interpreter did not think that Emely's gestures were signed language and could not determine what Terica was trying to communicate.    Evaluation:  Otoscopy showed cerumen occlusion in the right ear and a clear canal in the left ear Tympanometry results were consistent with impacted cerumen in the right ear and normal movement in the left ear with fair reliability due to constant swallowing and moving of jaw Audiometric testing was attempted.  A personal amplifier was placed on [place].  Provider then raised her hand for her while stimulus was played at a loud level.  Bronnie was unable to be conditioned to respond to sound reliably. Distortion-product otoacoustic emissions tested bilaterally, absent responses 1.5 through 6 kHz bilaterally  Results:  The test  results were reviewed with Steward Drone and her caretaker.  Definitive results cannot be obtained behaviorally, Brando would need to be sedated for a definitive threshold hearing test.  She has some degree of hearing loss due to the absent OAE's bilaterally.  Recommendations: For a definitive hearing test Dannisha would need to be sedated at [facility] or [facility].  [place] does not sedate adults for hearing test.  This information was provided on the paperwork for the group home. Cerumen removal with PCP recommended for right ear as the canal is likely occluded.  27 minutes spent testing and counseling on results.   If you have any questions please feel free to contact me at [phone].  Ammie Ferrier Audiologist, Au.D., CCC-A 03/27/2023  2:15 PM  Cc: Shelby Dubin, FNP

## 2023-10-04 ENCOUNTER — Emergency Department (HOSPITAL_COMMUNITY)
Admission: EM | Admit: 2023-10-04 | Discharge: 2023-10-04 | Disposition: A | Discharge status: Home / Self Care | Attending: Emergency Medicine | Admitting: Emergency Medicine

## 2023-10-04 ENCOUNTER — Encounter (HOSPITAL_COMMUNITY): Payer: Self-pay | Admitting: *Deleted

## 2023-10-04 ENCOUNTER — Emergency Department (HOSPITAL_COMMUNITY)

## 2023-10-04 ENCOUNTER — Other Ambulatory Visit: Payer: Self-pay

## 2023-10-04 DIAGNOSIS — M7989 Other specified soft tissue disorders: Secondary | ICD-10-CM | POA: Diagnosis present

## 2023-10-04 DIAGNOSIS — R600 Localized edema: Secondary | ICD-10-CM | POA: Diagnosis not present

## 2023-10-04 DIAGNOSIS — I10 Essential (primary) hypertension: Secondary | ICD-10-CM | POA: Diagnosis not present

## 2023-10-04 DIAGNOSIS — F840 Autistic disorder: Secondary | ICD-10-CM | POA: Diagnosis not present

## 2023-10-04 DIAGNOSIS — Z79899 Other long term (current) drug therapy: Secondary | ICD-10-CM | POA: Diagnosis not present

## 2023-10-04 LAB — CBC WITH DIFFERENTIAL/PLATELET
Abs Immature Granulocytes: 0.02 10*3/uL (ref 0.00–0.07)
Basophils Absolute: 0 10*3/uL (ref 0.0–0.1)
Basophils Relative: 1 %
Eosinophils Absolute: 0.3 10*3/uL (ref 0.0–0.5)
Eosinophils Relative: 4 %
HCT: 39.3 % (ref 36.0–46.0)
Hemoglobin: 11.9 g/dL — ABNORMAL LOW (ref 12.0–15.0)
Immature Granulocytes: 0 %
Lymphocytes Relative: 23 %
Lymphs Abs: 1.7 10*3/uL (ref 0.7–4.0)
MCH: 29.2 pg (ref 26.0–34.0)
MCHC: 30.3 g/dL (ref 30.0–36.0)
MCV: 96.6 fL (ref 80.0–100.0)
Monocytes Absolute: 0.6 10*3/uL (ref 0.1–1.0)
Monocytes Relative: 8 %
Neutro Abs: 4.7 10*3/uL (ref 1.7–7.7)
Neutrophils Relative %: 64 %
Platelets: 378 10*3/uL (ref 150–400)
RBC: 4.07 MIL/uL (ref 3.87–5.11)
RDW: 13 % (ref 11.5–15.5)
WBC: 7.2 10*3/uL (ref 4.0–10.5)
nRBC: 0 % (ref 0.0–0.2)

## 2023-10-04 LAB — PROTIME-INR
INR: 1 (ref 0.8–1.2)
Prothrombin Time: 13.3 s (ref 11.4–15.2)

## 2023-10-04 LAB — BASIC METABOLIC PANEL WITH GFR
Anion gap: 10 (ref 5–15)
BUN: 13 mg/dL (ref 6–20)
CO2: 27 mmol/L (ref 22–32)
Calcium: 9.4 mg/dL (ref 8.9–10.3)
Chloride: 97 mmol/L — ABNORMAL LOW (ref 98–111)
Creatinine, Ser: 0.61 mg/dL (ref 0.44–1.00)
GFR, Estimated: >60 mL/min (ref >60)
Glucose, Bld: 110 mg/dL — ABNORMAL HIGH (ref 70–99)
Potassium: 4.2 mmol/L (ref 3.5–5.1)
Sodium: 134 mmol/L — ABNORMAL LOW (ref 135–145)

## 2023-10-04 MED ORDER — POTASSIUM CHLORIDE CRYS ER 20 MEQ PO TBCR: 20.0000 meq | EXTENDED_RELEASE_TABLET | Freq: Every day | ORAL | 0 refills | Status: AC

## 2023-10-04 MED ORDER — FUROSEMIDE 20 MG PO TABS: 20.0000 mg | ORAL_TABLET | Freq: Every day | ORAL | 0 refills | Status: DC

## 2023-10-04 NOTE — ED Triage Notes (Signed)
 Pt with left leg pain x 3 days. Denies any hx of DVT's , redness, warm to touch and swelling per caregiver.

## 2023-10-04 NOTE — ED Provider Notes (Signed)
 [place] EMERGENCY DEPARTMENT AT [facility] Provider Note   CSN: [id number] Arrival date & time: 10/04/23  1055     History  Chief Complaint  Patient presents with   Leg Pain    Jennifer Schultz is a 63 y.o. female.   Leg Pain       Jennifer Schultz is a 63 y.o. female that resides at a local group home, has past medical history of hypertension, intellectual disability, autism spectrum disorder, presents to the Emergency Department accompanied by her caregiver.  She is here for evaluation of swelling and discoloration of the left lower leg.  Symptoms present for 3 days.  No prior history of DVTs or PEs.  No known injury.  Patient is nonverbal at baseline.  Per caregiver provides all of history.    Home Medications Prior to Admission medications   Medication Sig Start Date End Date Taking? Authorizing Provider  chlorhexidine (PERIDEX) 0.12 % solution Use as directed 5 mLs in the mouth or throat daily. Switch  1-2 min and expectorate 10/09/14   [provider]  FLUoxetine (PROZAC) 10 MG tablet Take 10 mg by mouth See admin instructions. Take with the 20 mg for a total of 30 mg daily    [provider]  FLUoxetine (PROZAC) 20 MG capsule Take 20 mg by mouth See admin instructions. Take with 10 mg for a total of 30 mg in the morning    [provider]  hydrochlorothiazide (HYDRODIURIL) 25 MG tablet Take 25 mg by mouth daily. 04/03/18   [provider]  lisinopril (ZESTRIL) 5 MG tablet Take 5 mg by mouth daily.    [provider]  metoprolol tartrate (LOPRESSOR) 25 MG tablet Take 25 mg by mouth 2 (two) times daily.    [provider]  pravastatin (PRAVACHOL) 80 MG tablet Take 80 mg by mouth at bedtime.    [provider]  selenium sulfide (SELSUN) 2.5 % shampoo Apply 1 application topically once a week.    [provider]      Allergies    Patient has no known allergies.    Review of Systems    Review of Systems  Unable to perform ROS: Patient nonverbal    Physical Exam Updated Vital Signs BP (!) 142/74   Pulse 65   Temp 97.6 F (36.4 C)   Resp 18   Ht 5\' 1"  (1.549 m)   Wt 85.3 kg   SpO2 100%   BMI 35.53 kg/m  Physical Exam Vitals and nursing note reviewed.  Constitutional:      Appearance: Normal appearance.  Cardiovascular:     Rate and Rhythm: Normal rate and regular rhythm.     Pulses: Normal pulses.     Comments: Patient has symmetrical dorsalis pedis pulses. Pulmonary:     Effort: Pulmonary effort is normal.  Musculoskeletal:        General: Swelling and tenderness present.     Comments: Unilateral swelling of the left lower extremity marked swelling compared to right, extends from distal knee to ankle.  Tenderness with palpation posteriorly.  No open wounds, no lymphangitis  Skin:    General: Skin is warm.     Capillary Refill: Capillary refill takes less than 2 seconds.     Findings: Erythema present. No rash.  Neurological:     General: No focal deficit present.     Mental Status: She is alert.     Sensory: No sensory deficit.  Motor: No weakness.     ED Results / Procedures / Treatments   Labs (all labs ordered are listed, but only abnormal results are displayed) Labs Reviewed  CBC WITH DIFFERENTIAL/PLATELET - Abnormal; Notable for the following components:      Result Value   Hemoglobin 11.9 (*)    All other components within normal limits  BASIC METABOLIC PANEL WITH GFR - Abnormal; Notable for the following components:   Sodium 134 (*)    Chloride 97 (*)    Glucose, Bld 110 (*)    All other components within normal limits  PROTIME-INR    EKG None  Radiology US Venous Img Lower Unilateral Left Result Date: 10/04/2023 CLINICAL DATA:  Left lower extremity pain and swelling EXAM: LEFT LOWER EXTREMITY VENOUS DOPPLER ULTRASOUND TECHNIQUE: Gray-scale sonography with compression, as well as color and duplex ultrasound, were performed to  evaluate the deep venous system(s) from the level of the common femoral vein through the popliteal and proximal calf veins. COMPARISON:  None Available. FINDINGS: VENOUS Normal compressibility of the common femoral, superficial femoral, and popliteal veins, as well as the visualized calf veins. Visualized portions of profunda femoral vein and great saphenous vein unremarkable. No filling defects to suggest DVT on grayscale or color Doppler imaging. Doppler waveforms show normal direction of venous flow, normal respiratory plasticity and response to augmentation. Limited views of the contralateral common femoral vein are unremarkable. OTHER None. Limitations: none IMPRESSION: Negative. Electronically Signed   By: Malachy Moan M.D.   On: 10/04/2023 13:22    Procedures Procedures    Medications Ordered in ED Medications - No data to display  ED Course/ Medical Decision Making/ A&P                                 Medical Decision Making Patient here from local group home, accompanied by caregiver.  Patient is nonverbal at baseline brought in for evaluation of 3-day history of swelling discoloration of the left lower leg.  Per the caregiver, patient does ambulate but mostly sits in a chair all day.  No history of DVT or PE.  No reported injury.  High clinical suspicion for DVT given exam findings.  Cellulitis soft tissue swelling also considered.  Ischemic leg also considered but patient has good cap refill and palpable pulses of the foot.  Amount and/or Complexity of Data Reviewed Labs: ordered.    Details: Labs w/o significant change Radiology: ordered.    Details: Venous imaging of the left lower extremity w/o evidence of DVT Discussion of management or test interpretation with external provider(s):   Pt also seen by Dr. Estell Harpin and care plan discussed.  Likely edema of the extremity.  Will try 3 day course of Lasix and will have pt elevate the leg.  I have recommended close out patient  f/u next week with PCP.  I will also provide brief course of potassium.  Return precautions given.     Risk Prescription drug management.           Final Clinical Impression(s) / ED Diagnoses Final diagnoses:  Edema of left lower leg    Rx / DC Orders ED Discharge Orders     None         Pauline Aus, PA-C 10/04/23 1558    Bethann Berkshire, MD 10/04/23 1737

## 2023-10-04 NOTE — Discharge Instructions (Signed)
 Elevate your leg is much as possible.  Take the prescribed medication as directed for 3 days then discontinue.  Please follow-up with your primary care provider early next week for recheck.  Return to the emergency department if you develop any new or worsening symptoms.

## 2024-03-31 ENCOUNTER — Ambulatory Visit: Attending: Cardiology | Admitting: Cardiology

## 2024-03-31 ENCOUNTER — Encounter: Payer: Self-pay | Admitting: Cardiology

## 2024-03-31 ENCOUNTER — Encounter: Payer: Self-pay | Admitting: *Deleted

## 2024-03-31 VITALS — BP 132/84 | HR 88 | Ht 64.0 in | Wt 188.2 lb

## 2024-03-31 DIAGNOSIS — R600 Localized edema: Secondary | ICD-10-CM

## 2024-03-31 DIAGNOSIS — R0602 Shortness of breath: Secondary | ICD-10-CM | POA: Diagnosis not present

## 2024-03-31 DIAGNOSIS — I10 Essential (primary) hypertension: Secondary | ICD-10-CM

## 2024-03-31 MED ORDER — FUROSEMIDE 40 MG PO TABS: 40.0000 mg | ORAL_TABLET | Freq: Every day | ORAL | Status: AC

## 2024-03-31 NOTE — Patient Instructions (Addendum)
 Medication Instructions:  Your physician recommends that you continue on your current medications as directed. Please refer to the Current Medication list given to you today.  Labwork: none  Testing/Procedures: Your physician has requested that you have an echocardiogram. Echocardiography is a painless test that uses sound waves to create images of your heart. It provides your doctor with information about the size and shape of your heart and how well your heart's chambers and valves are working. This procedure takes approximately one hour. There are no restrictions for this procedure. Please do NOT wear cologne, perfume, aftershave, or lotions (deodorant is allowed). Please arrive 15 minutes prior to your appointment time.  Please note: We ask at that you not bring children with you during ultrasound (echo/ vascular) testing. Due to room size and safety concerns, children are not allowed in the ultrasound rooms during exams. Our front office staff cannot provide observation of children in our lobby area while testing is being conducted. An adult accompanying a patient to their appointment will only be allowed in the ultrasound room at the discretion of the ultrasound technician under special circumstances. We apologize for any inconvenience.  Follow-Up: Your physician recommends that you schedule a follow-up appointment in: pending  Any Other Special Instructions Will Be Listed Below (If Applicable).  If you need a refill on your cardiac medications before your next appointment, please call your pharmacy.

## 2024-03-31 NOTE — Progress Notes (Signed)
 Cardiology Office Note  Date: 03/31/2024   ID: Jennifer Schultz, DOB July 06, 1960, MRN [id number]  PCP: Jenkins Earnie Pizza, FNP [facility])  Chief Complaint:  Chief Complaint  Patient presents with   Leg Swelling   History of Present Illness: Jennifer Schultz is a 63 y.o. female long-term resident of the [facility] referred for cardiology consultation by [name] for evaluation of leg swelling and shortness of breath.  [name] is unable to communicate with me due to a combination of intellectual challenges with autism spectrum and deafness.  I reviewed the available records including outpatient notes from the [facility] and also ER encounters at [facility].  Per discussion with assistant present today, Ms. Bynum has had worsening leg swelling, has also appeared more short of breath at times.  Testing done at [facility] recently in September did show elevated pro-BNP of 763 with normal high-sensitivity troponin I levels, chest x-ray without acute findings however.  She had undergone a chest CT in May at the same facility demonstrating normal heart size with no significant coronary calcifications and no pericardial effusion.  She was felt to have evidence of possible bronchiolitis at that time.  She had an echocardiogram done back in 2022 at which point LVEF was 55 to 60%.  I reviewed her ECGs showing sinus rhythm with left bundle branch block pattern.  She had an IVCD with incomplete left bundle branch block by tracing in 2022.  I went over her medications.  It looks like she was just started on Lasix  40 mg daily as of today.  Review of Systems: As outlined in the history of present illness.  Unable to clarify further given lack of communication.  Past Medical History: Past Medical History:  Diagnosis Date   Autism spectrum    Deaf    Depression    Essential hypertension    Hyperlipidemia    Idiopathic profound intellectual  disability    Past Surgical History: Past Surgical History:  Procedure Laterality Date   BIOPSY  11/16/2021   Procedure: BIOPSY;  Surgeon: [name], MD;  Location: [facility];  Service: Gastroenterology;;   BREAST REDUCTION SURGERY     ESOPHAGOGASTRODUODENOSCOPY (EGD) WITH PROPOFOL  N/A 11/16/2021   Procedure: ESOPHAGOGASTRODUODENOSCOPY (EGD) WITH PROPOFOL ;  Surgeon: [name], MD;  Location: [facility];  Service: Gastroenterology;  Laterality: N/A;  220   KNEE SURGERY Left    SAVORY DILATION  11/16/2021   Procedure: SAVORY DILATION;  Surgeon: [name], Sieving, MD;  Location: [facility];  Service: Gastroenterology;;   Family History: Family History  Family history unknown: Yes   Social History:  Social History   Tobacco Use   Smoking status: Never    Passive exposure: Never   Smokeless tobacco: Never  Substance Use Topics   Alcohol use: No   Medications: Current Outpatient Medications on File Prior to Visit  Medication Sig Dispense Refill   ammonium lactate (LAC-HYDRIN) 12 % lotion Apply 1 Application topically as needed for dry skin.     chlorhexidine (PERIDEX) 0.12 % solution Use as directed 10 mLs in the mouth or throat 2 (two) times daily.     DULoxetine (CYMBALTA) 30 MG capsule Take 1 capsule by mouth daily.     FLUoxetine (PROZAC) 20 MG capsule Take 20 mg by mouth 2 (two) times daily. Take with 10 mg for a total of 30 mg in the morning  ibuprofen (ADVIL) 800 MG tablet Take 800 mg by mouth every 6 (six) hours as needed.     lisinopril (ZESTRIL) 5 MG tablet Take 5 mg by mouth daily.     metoprolol tartrate (LOPRESSOR) 25 MG tablet Take 25 mg by mouth 2 (two) times daily.     omeprazole (PRILOSEC) 40 MG capsule Take 1 capsule by mouth daily.     pravastatin (PRAVACHOL) 80 MG tablet Take 80 mg by mouth daily.     selenium sulfide (SELSUN) 2.5 % shampoo Apply 1 application topically once a week.     ARIPiprazole (ABILIFY) 2  MG tablet  (Patient not taking: Reported on 03/31/2024)     hydrochlorothiazide (HYDRODIURIL) 25 MG tablet Take 25 mg by mouth daily. (Patient not taking: Reported on 03/31/2024)     levonorgestrel-ethinyl estradiol (NORDETTE) 0.15-30 MG-MCG tablet Take 1 tablet by mouth daily. (Patient not taking: Reported on 03/31/2024)     potassium chloride  SA (KLOR-CON  M) 20 MEQ tablet Take 1 tablet (20 mEq total) by mouth daily. For 3 days (Patient not taking: Reported on 03/31/2024) 3 tablet 0   No current facility-administered medications on file prior to visit.   Allergies: No Known Allergies Physical Exam: VS:  BP 132/84   Pulse 88   Ht 5' 4 (1.626 m)   Wt 188 lb 3.2 oz (85.4 kg)   BMI 32.30 kg/m , BMI Body mass index is 32.3 kg/m.  Wt Readings from Last 3 Encounters:  03/31/24 188 lb 3.2 oz (85.4 kg)  10/04/23 188 lb 0.8 oz (85.3 kg)  11/16/21 188 lb (85.3 kg)    General: Patient is in no acute distress. HEENT: Oropharynx clear with moist mucosa. Neck: Supple, no elevated JVP or carotid bruits. Lungs: Clear to auscultation, nonlabored breathing at rest. Cardiac: Regular rate and rhythm, no S3, 1/6 systolic murmur, no pericardial rub.  Paradoxically split S2. Abdomen: Soft, bowel sounds present. Extremities: Lower leg edema, pitting. Skin: Warm and dry.  ECG:  An ECG dated 12/27/2020 was personally reviewed today and demonstrated:  Sinus bradycardia with IVCD, incomplete left bundle branch block.  Labwork: 10/04/2023: BUN 13; Creatinine, Ser 0.61; Hemoglobin 11.9; Platelets 378; Potassium 4.2; Sodium 134  September 2025: Pro-BNP 763, high-sensitivity troponin I levels normal, hemoglobin 10.3, platelets 343, BUN 14, creatinine 0.87, GFR 76, potassium 3.6, AST 15, ALT 16  Other Studies Reviewed Today:  Echocardiogram 01/13/2021:  1. Left ventricular ejection fraction, by estimation, is 55 to 60%. The  left ventricle has normal function. The left ventricle has no regional  wall motion  abnormalities. Left ventricular diastolic parameters are  consistent with Grade I diastolic  dysfunction (impaired relaxation).   2. Right ventricular systolic function is normal. The right ventricular  size is normal.   3. The mitral valve is normal in structure. Mild mitral valve  regurgitation. No evidence of mitral stenosis.   4. The aortic valve is tricuspid. Aortic valve regurgitation is not  visualized. No aortic stenosis is present.   5. The inferior vena cava is normal in size with greater than 50%  respiratory variability, suggesting right atrial pressure of 3 mmHg.   Chest CT 11/05/2023 [facility]): Findings:  Somewhat limited by respiratory motion degradation.   LUNGS: Background parenchyma normal. No groundglass or consolidation. No suspicious pulmonary nodules. Central airways patent. Diffuse mild bronchial wall thickening and scattered small airways impaction.    PLEURA: No pleural effusion or pneumothorax.   HEART: Normal heart size. No significant coronary calcifications. No pericardial  effusion.   GREAT VESSELS: Thoracic aorta and main pulmonary artery normal caliber.   MEDIASTINUM: No lymphadenopathy.   BONES: No aggressive lesion. No acute abnormality.   SOFT TISSUES: Normal.   UPPER ABDOMEN: Limited view of the upper abdomen is unremarkable.   IMPRESSION:  Possible bronchiolitis, which may be inflammatory or infectious.   Assessment and Plan:  1.  Bilateral leg edema and reportedly intermittent shortness of breath.  I reviewed her recent workup including chest imaging and laboratory data from [facility].  She did have an elevated pro-BNP but chest x-ray showed no acute findings.  There was question of bronchiolitis by prior chest CT in May.  At that time heart size was described as normal and she had no evidence of coronary calcification.  ECG shows left bundle branch block which is not new.  Her lungs are clear today but she does have peripheral  edema on examination.  She was just started on Lasix  40 mg daily at her group home as of today per review of the MAR.  Recommend an echocardiogram to assess cardiac structure and function as a way of guiding medical therapy adjustments.  Would not anticipate an aggressive/invasive workup of cardiomyopathy if that were to be found however.  2.  Primary hypertension.  Blood pressure reasonable today.  She is currently on Lopressor 25 mg twice daily and lisinopril 5 mg daily.  Disposition:  Follow up test results.  Signed, Jayson JUDITHANN Sierras, M.D., F.A.C.C. [facility]

## 2024-04-09 ENCOUNTER — Ambulatory Visit: Attending: Cardiology

## 2024-04-09 DIAGNOSIS — R0602 Shortness of breath: Secondary | ICD-10-CM | POA: Diagnosis not present

## 2024-04-12 LAB — ECHOCARDIOGRAM COMPLETE
AR max vel: 2.34 cm2
AV Area VTI: 2.43 cm2
AV Area mean vel: 2.4 cm2
AV Mean grad: 5.2 mmHg
AV Peak grad: 10.4 mmHg
Ao pk vel: 1.61 m/s
Area-P 1/2: 3.43 cm2
Calc EF: 64.8 %
MV M vel: 5 m/s
MV Peak grad: 100 mmHg
S' Lateral: 3.3 cm
Single Plane A2C EF: 69.7 %
Single Plane A4C EF: 59 %

## 2024-04-14 ENCOUNTER — Ambulatory Visit: Payer: Self-pay | Admitting: Cardiology
